# Patient Record
Sex: Female | Born: 1940 | Race: White | Hispanic: No | Smoking: Never smoker
Health system: Southern US, Community
[De-identification: ages and names within clinical notes are randomized; demographics above are authoritative.]

## PROBLEM LIST (undated history)

## (undated) ENCOUNTER — Encounter

## (undated) DIAGNOSIS — G4733 Obstructive sleep apnea (adult) (pediatric): Secondary | ICD-10-CM

## (undated) DIAGNOSIS — E119 Type 2 diabetes mellitus without complications: Secondary | ICD-10-CM

## (undated) DIAGNOSIS — I10 Essential (primary) hypertension: Secondary | ICD-10-CM

## (undated) DIAGNOSIS — K7030 Alcoholic cirrhosis of liver without ascites: Secondary | ICD-10-CM

## (undated) DIAGNOSIS — I8500 Esophageal varices without bleeding: Secondary | ICD-10-CM

## (undated) DIAGNOSIS — E039 Hypothyroidism, unspecified: Secondary | ICD-10-CM

## (undated) DIAGNOSIS — J309 Allergic rhinitis, unspecified: Secondary | ICD-10-CM

## (undated) DIAGNOSIS — M797 Fibromyalgia: Secondary | ICD-10-CM

## (undated) DIAGNOSIS — J45909 Unspecified asthma, uncomplicated: Secondary | ICD-10-CM

## (undated) HISTORY — DX: Unspecified asthma, uncomplicated: J45.909

## (undated) HISTORY — PX: OTHER SURGICAL HISTORY: SHX169

## (undated) HISTORY — DX: Obstructive sleep apnea (adult) (pediatric): G47.33

## (undated) HISTORY — DX: Allergic rhinitis, unspecified: J30.9

## (undated) HISTORY — PX: ABDOMINAL HYSTERECTOMY: SHX81

## (undated) HISTORY — DX: Essential (primary) hypertension: I10

## (undated) HISTORY — DX: Type 2 diabetes mellitus without complications: E11.9

---

## 2000-07-05 ENCOUNTER — Ambulatory Visit (HOSPITAL_COMMUNITY): Admission: RE | Admit: 2000-07-05 | Discharge: 2000-07-05 | Payer: Self-pay | Admitting: Gastroenterology

## 2001-02-10 ENCOUNTER — Ambulatory Visit (HOSPITAL_COMMUNITY): Admission: RE | Admit: 2001-02-10 | Discharge: 2001-02-10 | Payer: Self-pay | Admitting: *Deleted

## 2001-02-10 ENCOUNTER — Encounter: Payer: Self-pay | Admitting: *Deleted

## 2001-02-18 ENCOUNTER — Ambulatory Visit: Admission: RE | Admit: 2001-02-18 | Discharge: 2001-02-18 | Payer: Self-pay | Admitting: Pulmonary Disease

## 2001-04-11 ENCOUNTER — Ambulatory Visit (HOSPITAL_BASED_OUTPATIENT_CLINIC_OR_DEPARTMENT_OTHER): Admission: RE | Admit: 2001-04-11 | Discharge: 2001-04-11 | Payer: Self-pay | Admitting: Pulmonary Disease

## 2001-11-26 ENCOUNTER — Encounter: Admission: RE | Admit: 2001-11-26 | Discharge: 2002-02-24 | Payer: Self-pay | Admitting: Internal Medicine

## 2003-10-21 ENCOUNTER — Observation Stay (HOSPITAL_COMMUNITY): Admission: RE | Admit: 2003-10-21 | Discharge: 2003-10-22 | Payer: Self-pay | Admitting: General Surgery

## 2007-06-12 ENCOUNTER — Ambulatory Visit (HOSPITAL_COMMUNITY): Admission: RE | Admit: 2007-06-12 | Discharge: 2007-06-12 | Payer: Self-pay | Admitting: Internal Medicine

## 2009-06-15 ENCOUNTER — Ambulatory Visit (HOSPITAL_COMMUNITY): Admission: RE | Admit: 2009-06-15 | Discharge: 2009-06-15 | Payer: Self-pay | Admitting: Internal Medicine

## 2010-09-08 NOTE — Op Note (Signed)
NAME:  Martha Wise                     ACCOUNT NO.:  [id number]   MEDICAL RECORD NO.:  [id number]                   PATIENT TYPE:  AMB   LOCATION:  DAY                                  FACILITY:  [facility]   PHYSICIAN:  Adolph Pollack, M.D.            DATE OF BIRTH:  11-03-1940   DATE OF PROCEDURE:  10/21/2003  DATE OF DISCHARGE:                                 OPERATIVE REPORT   PREOPERATIVE DIAGNOSIS:  Incarcerated ventral incisional hernia.   POSTOPERATIVE DIAGNOSIS:  Incarcerated ventral incisional hernia.   PROCEDURE:  Laparoscopic repair of incarcerated ventral incisional hernia  with mesh.   SURGEON:  Adolph Pollack, M.D.   ANESTHESIA:  General.   INDICATIONS:  [name] is a 70 year old female who has had two operations  through the periumbilical incisions, and now she has been noting a bulge is  increasing and size and starting to cause her pain.  In the office there is  a periumbilical bulge that was not able to be reducible in the periumbilical  incision.  She now presents for elective repair.   The procedure and the risks have been discussed with her.   TECHNIQUE:  She is seen in the holding area then brought to the operating  room, placed supine on the operating room table and general anesthetic was  administered.  A Foley catheter was placed in the bladder.  She had a little  bit of a rash around her umbilicus, but given the fact we were not going to  make a periumbilical incision, I felt it was okay to proceed.  The abdominal  wall was then sterilely prepped and draped, then I-Ban was used to cover the  skin.   I then made a small incision in the left upper quadrant and was able to  palpate the fascia.  Using Opti-View trocar (5 mm), I gently advanced the  trocar through the fascial layers and into the peritoneal cavity.  There the  omentum came directly into view.  I insufflated CO2 gas and then examined  the area under the trocar; all there was  seen was omentum, no bowel was  present and no intestinal injury was noted.  Following adequate insufflation  of the abdomen, I put a 5 mm trocar in the left lower quadrant.  I manually  reduced part of the omental contents from the anterior abdominal wall.  I  then grasped the remaining omentum with atraumatic grasped and pulling out  the hernia and exposing the defect (which measured 4 cm x 4 cm).  I then  used spinal needles to mark an area 3-4 cm away from the edges of the defect  in four quadrants.  I brought out a piece of 10 x 15 cm polypropylene mesh  with a nonadherent barrier.  This mesh was cut in an oval-type shape, and  then four anchoring sutures were placed in four quadrants of 0 Novofil.  The  mesh was then rolled up, placed into the abdominal cavity with the  nonadherent barrier facing the omentum and visceral side.  Four small  incisions were then made in the anterior abdominal wall (four quadrants).  The anchoring sutures were pulled up across the fascial bridge at each of  these four sites, and then tied down anchoring the mesh to the abdominal  wall.  Further anchoring of the mesh was performed with the spiral tacker.  This provided for more than adequate coverage of the hernia, as well as more  than adequate overlap.   The area was inspected and was hemostatic.  The pneumoperitoneum was  released, and I watched the omentum approximate the nonadherent side of the  mesh, and then I removed the remaining trocar.  I then closed all the  incisions with 4-0 Monocryl subcuticular stitches for the skin closure, and  followed these by Steri-Strips.  The periumbilical area had a Betadine soak-  up gauze, followed by dry gauze placed on it.   She tolerated the procedure well without any apparent complications.  She  was taken to the recovery room in satisfactory condition.                                               Adolph Pollack, M.D.    Kari Baars  D:  10/21/2003   T:  10/21/2003  Job:  [id number]   cc:   Lovenia Kim, D.O.  [address]  [place]  [place] [address]  Fax: [phone]   Malachi Pro. Ambrose Mantle, M.D.  [address]  [place]  [place] [address]  Fax: [phone]

## 2010-09-08 NOTE — Procedures (Signed)
[facility]  Patient:    Martha Wise, Martha Wise                  MRN: [id number] Proc. Date: 07/05/00 Adm. Date:  [id number] Attending:  Rich Brave CC:         Ammie Dalton, M.D.   Procedure Report  PROCEDURE:  Colonoscopy with polypectomy.  INDICATION:  History of intermittent rectal bleeding and need for colon cancer screening.  FINDINGS:  Moderate left-sided diverticulsis.  Medium sized polyp removed from sigmoid colon.  INFORMED CONSENT:  The nature, purpose, and risks of the procedure have been reviewed with the patient who provided written consent.  SEDATION:  Fentanyl 87.5 mcg and Versed 8 mg IV without arrhythmias or desaturation.  DESCRIPTION OF PROCEDURE:  The Olympus adult video colonoscope was advanced to the cecum, as identified by clear visualization of the ileocecal valve.  The base of the cecum was inspected, and pullback was then performed.  The quality of the prep was very good, and it is felt that all areas were well seen.  There was an 8-9 mm semi-pedunculated polyp on a fold at about 30 cm from the external anal opening, as measured during pullback.  This was removed by snare technique with complete hemostasis and no evidence of excessive cautery.  There was moderate left-sided diverticulosis.  There was no evidence of colitis, polyps other than the one mentioned above, cancer, or vascular malformations, and retroflexion of the rectum was unremarkable.  The patient tolerated the procedure well, and there were no apparent complications.  To reach the cecum, we did have to do some overriding of loops.  IMPRESSION: 1. Sigmoid polyp. 2. Left-sided diverticulosis. 3. Intermittent rectal bleeding felt to be most likely of hemorrhoidal origin    since no alternative site was seen on todays exam.  PLAN:  Await pathology on the polyp.  Probable colonoscopic follow-up in three years. DD:  07/05/00 TD:   07/05/00 Job: [id number]

## 2010-12-14 ENCOUNTER — Other Ambulatory Visit: Payer: Self-pay | Admitting: Internal Medicine

## 2010-12-14 DIAGNOSIS — R7989 Other specified abnormal findings of blood chemistry: Secondary | ICD-10-CM

## 2010-12-18 ENCOUNTER — Ambulatory Visit
Admission: RE | Admit: 2010-12-18 | Discharge: 2010-12-18 | Disposition: A | Discharge status: Home / Self Care | Payer: Medicare Other | Source: Ambulatory Visit | Attending: Internal Medicine | Admitting: Internal Medicine

## 2010-12-26 ENCOUNTER — Other Ambulatory Visit: Payer: Self-pay | Admitting: Internal Medicine

## 2010-12-26 DIAGNOSIS — R160 Hepatomegaly, not elsewhere classified: Secondary | ICD-10-CM

## 2010-12-28 ENCOUNTER — Ambulatory Visit
Admission: RE | Admit: 2010-12-28 | Discharge: 2010-12-28 | Disposition: A | Discharge status: Home / Self Care | Payer: Medicare Other | Source: Ambulatory Visit | Attending: Internal Medicine | Admitting: Internal Medicine

## 2010-12-28 DIAGNOSIS — R160 Hepatomegaly, not elsewhere classified: Secondary | ICD-10-CM

## 2010-12-28 MED ORDER — IOHEXOL 350 MG/ML SOLN
125.0000 mL | Freq: Once | INTRAVENOUS | Status: AC | PRN
  Administered 2010-12-28: 125 mL via INTRAVENOUS

## 2011-04-27 ENCOUNTER — Ambulatory Visit (HOSPITAL_COMMUNITY)
Admission: RE | Admit: 2011-04-27 | Discharge: 2011-04-27 | Disposition: A | Discharge status: Home / Self Care | Payer: Medicare Other | Source: Ambulatory Visit | Attending: Internal Medicine | Admitting: Internal Medicine

## 2011-04-27 ENCOUNTER — Other Ambulatory Visit (HOSPITAL_COMMUNITY): Payer: Self-pay | Admitting: Internal Medicine

## 2011-04-27 DIAGNOSIS — Z0000 Encounter for general adult medical examination without abnormal findings: Secondary | ICD-10-CM | POA: Insufficient documentation

## 2011-04-27 DIAGNOSIS — I10 Essential (primary) hypertension: Secondary | ICD-10-CM

## 2011-04-27 DIAGNOSIS — G4730 Sleep apnea, unspecified: Secondary | ICD-10-CM | POA: Insufficient documentation

## 2011-04-27 DIAGNOSIS — R062 Wheezing: Secondary | ICD-10-CM | POA: Insufficient documentation

## 2011-10-19 ENCOUNTER — Other Ambulatory Visit: Payer: Self-pay | Admitting: Gastroenterology

## 2011-10-19 DIAGNOSIS — K7460 Unspecified cirrhosis of liver: Secondary | ICD-10-CM

## 2012-01-30 ENCOUNTER — Encounter: Payer: Self-pay | Admitting: Pulmonary Disease

## 2012-01-30 ENCOUNTER — Other Ambulatory Visit: Payer: Medicare Other

## 2012-01-30 ENCOUNTER — Institutional Professional Consult (permissible substitution): Payer: Medicare Other | Admitting: Pulmonary Disease

## 2012-01-31 ENCOUNTER — Encounter: Payer: Self-pay | Admitting: Pulmonary Disease

## 2012-01-31 ENCOUNTER — Ambulatory Visit (INDEPENDENT_AMBULATORY_CARE_PROVIDER_SITE_OTHER): Payer: Medicare Other | Admitting: Pulmonary Disease

## 2012-01-31 VITALS — BP 134/70 | HR 85 | Temp 98.2°F | Ht 65.0 in | Wt 272.0 lb

## 2012-01-31 DIAGNOSIS — G4733 Obstructive sleep apnea (adult) (pediatric): Secondary | ICD-10-CM

## 2012-01-31 NOTE — Patient Instructions (Addendum)
Will get you a new cpap machine, and take this opportunity to re-optimize your pressure.  Will let you know the results. Continue to work on weight loss. followup with me in one year if doing well.

## 2012-01-31 NOTE — Assessment & Plan Note (Signed)
The patient has a history of obstructive sleep apnea, and has done extremely well on CPAP.  She unfortunately is using the same machine from 2002, and is over due for a new device.  Will send an order to her DME to arrange for a new machine, and we'll take this opportunity to re- optimize her pressure.  I have encouraged her to work aggressively on weight loss, and to keep up with her mask changes and supplies.  She will followup with me in one year if doing well.

## 2012-01-31 NOTE — Progress Notes (Signed)
  Subjective:    Patient ID: Martha Wise, female    DOB: [birth year], 71 y.o.   MRN: [id number]  HPI The patient is a 71 year old female who I've been asked to see for management of obstructive sleep apnea.  She was diagnosed in 2002, and has been on CPAP since that time.  She has done very well with the device, but unfortunately he is using the same machine from 2002.  It has begun to act up.  The patient uses a full facemask, and has kept up with her mask changes.  She feels rested in the mornings upon arising, but has some sleep pressure during the day.  She has been heard to have occasional breakthrough snoring.  She has noted sleep pressure at times with periods of inactivity, especially with reading or watching television.  She denies any sleepiness with driving.  The patient states that her weight is down 50-60 pounds of the last few years, and her Epworth score today is 10.  Sleep Questionnaire: What time do you typically go to bed?( Between what hours) 10 - 11 pm How long does it take you to fall asleep? pretty quick uses cpap How many times during the night do you wake up? 1 What time do you get out of bed to start your day? 0800 Do you drive or operate heavy machinery in your occupation? No How much has your weight changed (up or down) over the past two years? (In pounds) 50 lb (22.68 kg) Have you ever had a sleep study before? Yes If yes, location of study? [facility] If yes, date of study? 2002 Do you currently use CPAP? Yes If so, what pressure? not sure Do you wear oxygen at any time? No    Review of Systems  Constitutional: Positive for unexpected weight change. Negative for fever.  HENT: Positive for nosebleeds, congestion and trouble swallowing. Negative for ear pain, sore throat, rhinorrhea, sneezing, dental problem, postnasal drip and sinus pressure.   Eyes: Negative for redness and itching.  Respiratory: Positive for shortness of breath and wheezing. Negative for cough and chest  tightness.   Cardiovascular: Positive for leg swelling. Negative for palpitations.  Gastrointestinal: Negative for nausea and vomiting.  Genitourinary: Negative for dysuria.  Musculoskeletal: Positive for joint swelling.  Skin: Negative for rash.  Neurological: Negative for headaches.  Hematological: Does not bruise/bleed easily.  Psychiatric/Behavioral: Negative for dysphoric mood. The patient is nervous/anxious.        Objective:   Physical Exam Constitutional:  Obese female, no acute distress  HENT:  Nares patent without discharge  Oropharynx without exudate, palate and uvula are elongated.  Eyes:  Perrla, eomi, no scleral icterus  Neck:  No JVD, no TMG  Cardiovascular:  Normal rate, regular rhythm, no rubs or gallops.  No murmurs        Intact distal pulses  Pulmonary :  Normal breath sounds, no stridor or respiratory distress   No rales, rhonchi, or wheezing  Abdominal:  Soft, nondistended, bowel sounds present.  No tenderness noted.   Musculoskeletal:  1+ lower extremity edema noted.  Lymph Nodes:  No cervical lymphadenopathy noted  Skin:  No cyanosis noted  Neurologic:  Alert, appropriate, moves all 4 extremities without obvious deficit.         Assessment & Plan:

## 2012-02-13 ENCOUNTER — Encounter: Payer: Self-pay | Admitting: Pulmonary Disease

## 2012-02-20 ENCOUNTER — Ambulatory Visit
Admission: RE | Admit: 2012-02-20 | Discharge: 2012-02-20 | Disposition: A | Discharge status: Home / Self Care | Payer: Medicare Other | Source: Ambulatory Visit | Attending: Gastroenterology | Admitting: Gastroenterology

## 2012-02-20 ENCOUNTER — Other Ambulatory Visit: Payer: Self-pay | Admitting: Gastroenterology

## 2012-02-20 DIAGNOSIS — K7460 Unspecified cirrhosis of liver: Secondary | ICD-10-CM

## 2012-02-21 ENCOUNTER — Encounter: Payer: Self-pay | Admitting: Pulmonary Disease

## 2012-03-16 ENCOUNTER — Other Ambulatory Visit: Payer: Self-pay | Admitting: Pulmonary Disease

## 2012-03-16 DIAGNOSIS — G4733 Obstructive sleep apnea (adult) (pediatric): Secondary | ICD-10-CM

## 2012-03-25 ENCOUNTER — Ambulatory Visit (INDEPENDENT_AMBULATORY_CARE_PROVIDER_SITE_OTHER): Payer: Medicare Other | Admitting: Pulmonary Disease

## 2012-03-25 ENCOUNTER — Encounter: Payer: Self-pay | Admitting: Pulmonary Disease

## 2012-03-25 VITALS — BP 124/72 | HR 72 | Temp 98.2°F | Ht 64.25 in | Wt 276.2 lb

## 2012-03-25 DIAGNOSIS — G4733 Obstructive sleep apnea (adult) (pediatric): Secondary | ICD-10-CM

## 2012-03-25 NOTE — Progress Notes (Signed)
  Subjective:    Patient ID: Martha Wise, female    DOB: 1940/08/12, 71 y.o.   MRN: [id number]  HPI The patient comes in today for followup of her obstructive sleep apnea.  She has gotten a new CPAP machine, and we have optimized her pressure to 13 cm of water.  She has been wearing CPAP religiously, and feels that it is really helping her sleep and daytime alertness.  Her only complaint is an issue with her mask being a little small, and she has an upcoming appointment with her medical equipment company to look at different types of masks.   Review of Systems  Constitutional: Negative for fever and unexpected weight change.  HENT: Positive for congestion. Negative for ear pain, nosebleeds, sore throat, rhinorrhea, sneezing, trouble swallowing, dental problem, postnasal drip and sinus pressure.   Eyes: Negative for redness and itching.  Respiratory: Positive for cough ( in mornings), shortness of breath and wheezing. Negative for chest tightness.   Cardiovascular: Negative for palpitations and leg swelling.  Gastrointestinal: Negative for nausea and vomiting.  Genitourinary: Negative for dysuria.  Musculoskeletal: Negative for joint swelling.  Skin: Negative for rash.  Neurological: Positive for headaches.  Hematological: Does not bruise/bleed easily.  Psychiatric/Behavioral: Negative for dysphoric mood. The patient is nervous/anxious.        Objective:   Physical Exam Overweight female in no acute distress No skin breakdown or pressure necrosis from the CPAP mask Nose without purulence or discharge noted Neck without lymphadenopathy or thyromegaly Lower extremities have minimal edema, no cyanosis Alert and oriented, moves all 4 extremities.  Does not appear to be sleepy.       Assessment & Plan:

## 2012-03-25 NOTE — Assessment & Plan Note (Signed)
The patient is doing much better on her new CPAP machine, and feels that she is sleeping well.  He has an appointment with her medical equipment company for a mask fitting, and they will also set her machine on 13 cm of water.  I've encouraged her to work aggressively on weight loss, and to keep up with mask changes and supplies.

## 2012-03-25 NOTE — Patient Instructions (Addendum)
Continue with cpap, and work on weight loss Make sure [employer] sets your machine on 13, and work with them on a better fitting mask. Try turning heat up on your humidifier, or can try a cloth sleeve on your hose if it already has the heater filament inside the wall of the hose. followup with me in one year, but call if you are not doing well with cpap.

## 2012-05-10 ENCOUNTER — Other Ambulatory Visit: Payer: Self-pay | Admitting: Pulmonary Disease

## 2012-10-24 ENCOUNTER — Emergency Department (HOSPITAL_COMMUNITY)
Admission: EM | Admit: 2012-10-24 | Discharge: 2012-10-25 | Disposition: A | Discharge status: Home / Self Care | Payer: Medicare Other | Attending: Emergency Medicine | Admitting: Emergency Medicine

## 2012-10-24 ENCOUNTER — Encounter (HOSPITAL_COMMUNITY): Payer: Self-pay | Admitting: Emergency Medicine

## 2012-10-24 ENCOUNTER — Emergency Department (HOSPITAL_COMMUNITY): Payer: Medicare Other

## 2012-10-24 DIAGNOSIS — IMO0002 Reserved for concepts with insufficient information to code with codable children: Secondary | ICD-10-CM | POA: Insufficient documentation

## 2012-10-24 DIAGNOSIS — Z8709 Personal history of other diseases of the respiratory system: Secondary | ICD-10-CM | POA: Insufficient documentation

## 2012-10-24 DIAGNOSIS — Z79899 Other long term (current) drug therapy: Secondary | ICD-10-CM | POA: Insufficient documentation

## 2012-10-24 DIAGNOSIS — Z8669 Personal history of other diseases of the nervous system and sense organs: Secondary | ICD-10-CM | POA: Insufficient documentation

## 2012-10-24 DIAGNOSIS — Y9389 Activity, other specified: Secondary | ICD-10-CM | POA: Insufficient documentation

## 2012-10-24 DIAGNOSIS — J45909 Unspecified asthma, uncomplicated: Secondary | ICD-10-CM | POA: Insufficient documentation

## 2012-10-24 DIAGNOSIS — S8392XA Sprain of unspecified site of left knee, initial encounter: Secondary | ICD-10-CM

## 2012-10-24 DIAGNOSIS — E119 Type 2 diabetes mellitus without complications: Secondary | ICD-10-CM | POA: Insufficient documentation

## 2012-10-24 DIAGNOSIS — I10 Essential (primary) hypertension: Secondary | ICD-10-CM | POA: Insufficient documentation

## 2012-10-24 DIAGNOSIS — Z794 Long term (current) use of insulin: Secondary | ICD-10-CM | POA: Insufficient documentation

## 2012-10-24 DIAGNOSIS — Y929 Unspecified place or not applicable: Secondary | ICD-10-CM | POA: Insufficient documentation

## 2012-10-24 DIAGNOSIS — E669 Obesity, unspecified: Secondary | ICD-10-CM | POA: Insufficient documentation

## 2012-10-24 DIAGNOSIS — Z7982 Long term (current) use of aspirin: Secondary | ICD-10-CM | POA: Insufficient documentation

## 2012-10-24 MED ORDER — IBUPROFEN 800 MG PO TABS
800.0000 mg | ORAL_TABLET | Freq: Once | ORAL | Status: AC
  Administered 2012-10-25: 800 mg via ORAL
  Filled 2012-10-24: qty 1

## 2012-10-24 NOTE — ED Provider Notes (Signed)
History    CSN: [id number] Arrival date & time 10/24/12  2235  First MD Initiated Contact with Patient 10/24/12 2251     Chief Complaint  Patient presents with  . Knee Injury   (Consider location/radiation/quality/duration/timing/severity/associated sxs/prior Treatment) HPI Patient presents with knee injury. She states that she was getting out of her car which was on an embankment and misjudged her height, causing her to jam her knee. She states she was able to "hobble" on the knee greater than 4 steps, she has had progressively worsening pain and some mild swelling. Pain is worsened with bending and ambulation, better with rest. She took nothing for the pain. Denies numbness, tingling, weakness.  Past Medical History  Diagnosis Date  . OSA (obstructive sleep apnea)   . Asthma   . Hypertension   . Diabetes   . Allergic rhinitis    Past Surgical History  Procedure Laterality Date  . Abdominal hysterectomy    . Bladder tact     Family History  Problem Relation Age of Onset  . Allergies Mother   . Asthma Mother     children   . Heart disease      father side of family  . Cancer Mother    History  Substance Use Topics  . Smoking status: Never Smoker   . Smokeless tobacco: Never Used  . Alcohol Use: 0.0 oz/week     Comment: drinks wine    OB History   Grav Para Term Preterm Abortions TAB SAB Ect Mult Living                 Review of Systems  Constitutional: Positive for activity change.  Respiratory: Negative for shortness of breath.   Gastrointestinal: Negative for nausea and vomiting.  Musculoskeletal: Positive for joint swelling, arthralgias and gait problem. Negative for myalgias.  Skin: Negative for wound.  Neurological: Negative for dizziness, weakness and numbness.    Allergies  Phenergan; Hydrocodone; and Glimepiride  Home Medications   Current Outpatient Rx  Name  Route  Sig  Dispense  Refill  . aspirin 81 MG tablet   Oral   Take 81 mg by mouth  daily.         . cholecalciferol (VITAMIN D) 1000 UNITS tablet   Oral   Take 200 Units by mouth as needed.          Marland Kitchen FLUoxetine (PROZAC) 20 MG capsule   Oral   Take 20 mg by mouth daily.         . Fluticasone-Salmeterol (ADVAIR) 250-50 MCG/DOSE AEPB   Inhalation   Inhale 1 puff into the lungs daily.          Marland Kitchen LEVEMIR FLEXPEN 100 UNIT/ML injection      Take 20 units at noon and 70 units at bedtime         . metFORMIN (GLUCOPHAGE-XR) 500 MG 24 hr tablet   Oral   Take 500 mg by mouth. 2 tablets in the am         . NOVOFINE 30G X 8 MM MISC               . NOVOLOG FLEXPEN 100 UNIT/ML injection      Sliding Scale in AM...8 units at noon and 8 units at dinner         . ONETOUCH DELICA LANCETS MISC               . SYNTHROID 125 MCG tablet  Oral   Take 125 mcg by mouth daily.          . valsartan (DIOVAN) 320 MG tablet   Oral   Take 320 mg by mouth daily.          BP 165/83  Pulse 89  Temp(Src) 97.9 F (36.6 C) (Oral)  Resp 16  SpO2 96% Physical Exam  Nursing note and vitals reviewed. Constitutional: She is oriented to person, place, and time. She appears well-developed and well-nourished. No distress.  Pleasant obese female in NAD  HENT:  Head: Normocephalic and atraumatic.  Eyes: Conjunctivae are normal. No scleral icterus.  Neck: Normal range of motion.  Cardiovascular: Normal rate, regular rhythm and normal heart sounds.  Exam reveals no gallop and no friction rub.   No murmur heard. Pulmonary/Chest: Effort normal and breath sounds normal. No respiratory distress.  Abdominal: Soft. Bowel sounds are normal. She exhibits no distension and no mass. There is no tenderness. There is no guarding.  Musculoskeletal:  TTP in the posterior knee. Popliteal and distal pulses are normal. Flexion to 90 degrees with pain, extension to 180 without  Pain. No lateral or medial instability.  dtr normal  Neurological: She is alert and oriented to  person, place, and time.  Skin: Skin is warm and dry. She is not diaphoretic.    ED Course  Procedures (including critical care time) Labs Reviewed - No data to display Dg Knee Complete 4 Views Left  10/24/2012   *RADIOLOGY REPORT*  Clinical Data: Knee injury.  Jammed left knee stepping out of car. Left knee pain.  LEFT KNEE - COMPLETE 4+ VIEW  Comparison: None.  Findings: Degenerative changes in the left knee with mild tricompartmental narrowing and hypertrophic changes.  No evidence of acute fracture or subluxation.  No focal bone lesion or bone destruction.  Bone cortex and trabecular architecture appear intact.  No significant effusion.  IMPRESSION: Mild degenerative changes in the left knee.  No acute fractures demonstrated.   Original Report Authenticated By: Burman Nieves, M.D.   1. Knee sprain, left, initial encounter     MDM  Patient presenting with knee injury. She has a history of knee OA. She is able to ambulate, however it causes severe pain. Elveria Royals is negative. I do not suspect internal derangement. Will d/c with supportive care and follow up with her orthopedist Dr, Ambrose Mantle. The patient appears reasonably screened and/or stabilized for discharge and I doubt any other medical condition or other [facility] requiring further screening, evaluation, or treatment in the ED at this time prior to discharge.   Arthor Captain, PA-C 10/25/12 1347  Arthor Captain, PA-C 11/02/12 2108

## 2012-10-24 NOTE — ED Notes (Signed)
Patient reports that she was stepping out of a car and she jammed her left knee. The patient reports that her knee and back of her left leg hurts.

## 2012-10-25 MED ORDER — MELOXICAM 15 MG PO TABS: 15.0000 mg | ORAL_TABLET | Freq: Every day | ORAL | Status: DC

## 2012-11-08 NOTE — ED Provider Notes (Signed)
Medical screening examination/treatment/procedure(s) were performed by non-physician practitioner and as supervising physician I was immediately available for consultation/collaboration.  Ethelda Chick, MD 11/08/12 [phone]

## 2013-02-04 ENCOUNTER — Encounter: Payer: Self-pay | Admitting: Pulmonary Disease

## 2013-02-04 ENCOUNTER — Ambulatory Visit (INDEPENDENT_AMBULATORY_CARE_PROVIDER_SITE_OTHER): Payer: Medicare Other | Admitting: Pulmonary Disease

## 2013-02-04 VITALS — BP 132/78 | HR 69 | Temp 98.3°F | Ht 64.25 in | Wt 278.6 lb

## 2013-02-04 DIAGNOSIS — G4733 Obstructive sleep apnea (adult) (pediatric): Secondary | ICD-10-CM

## 2013-02-04 NOTE — Assessment & Plan Note (Signed)
The patient is doing well with CPAP, and feels that it is helping her sleep and daytime alertness.  We do need to make sure she does have to adjust her heat humidifier, and she also needs to keep up with her cushion changes.  I've also encouraged her to work aggressively on weight loss.

## 2013-02-04 NOTE — Patient Instructions (Signed)
Get your home care company to show you how to use heated humidifier. Keep up with mask cushion changes. Work on weight loss followup with me in one year if doing well.

## 2013-02-04 NOTE — Progress Notes (Signed)
  Subjective:    Patient ID: Martha Wise, female    DOB: [birth year], 72 y.o.   MRN: [id number]  HPI The patient comes in today for followup of her obstructive sleep apnea.  She is wearing CPAP compliantly, and feels that she sleeps well with the device.  She is satisfied with her daytime alertness.  She is having no issues with the pressure or the mask fit, but needs to keep up with her cushion changes more consistently.  She is worried about dryness during the winter months, and I have explained to her how to use her heated humidifier.  Of note, the patient's weight is stable from the last visit.   Review of Systems  Constitutional: Negative for fever and unexpected weight change.  HENT: Positive for nosebleeds ( h/o in the last year..d/t CPAP?). Negative for congestion, dental problem, ear pain, postnasal drip, rhinorrhea, sinus pressure, sneezing, sore throat and trouble swallowing.        Dry mouth  Eyes: Negative for redness and itching.  Respiratory: Negative for cough, chest tightness, shortness of breath and wheezing.   Cardiovascular: Negative for palpitations and leg swelling.  Gastrointestinal: Negative for nausea and vomiting.  Genitourinary: Negative for dysuria.  Musculoskeletal: Negative for joint swelling.  Skin: Negative for rash.  Neurological: Negative for headaches.  Hematological: Does not bruise/bleed easily.  Psychiatric/Behavioral: Negative for dysphoric mood. The patient is not nervous/anxious.        Objective:   Physical Exam Obese female in no acute distress Nose without purulent discharge noted No skin breakdown or pressure necrosis from the CPAP mask Lower extremities with mild edema, cyanosis Alert and oriented, moves all 4 extremities.       Assessment & Plan:

## 2013-03-02 ENCOUNTER — Other Ambulatory Visit: Payer: Self-pay | Admitting: Gastroenterology

## 2013-03-02 DIAGNOSIS — K7460 Unspecified cirrhosis of liver: Secondary | ICD-10-CM

## 2013-03-06 ENCOUNTER — Other Ambulatory Visit: Payer: Medicare Other

## 2013-03-25 ENCOUNTER — Ambulatory Visit
Admission: RE | Admit: 2013-03-25 | Discharge: 2013-03-25 | Disposition: A | Discharge status: Home / Self Care | Payer: Medicare Other | Source: Ambulatory Visit | Attending: Gastroenterology | Admitting: Gastroenterology

## 2013-03-25 ENCOUNTER — Ambulatory Visit: Payer: Medicare Other | Admitting: Pulmonary Disease

## 2013-03-25 DIAGNOSIS — K7460 Unspecified cirrhosis of liver: Secondary | ICD-10-CM

## 2013-04-17 ENCOUNTER — Other Ambulatory Visit: Payer: Self-pay | Admitting: Physician Assistant

## 2013-05-12 NOTE — Telephone Encounter (Signed)
PT CLOSED CHART IN MEDICAL MANAGER.

## 2014-01-14 ENCOUNTER — Other Ambulatory Visit: Payer: Self-pay | Admitting: Gastroenterology

## 2014-01-14 DIAGNOSIS — K7460 Unspecified cirrhosis of liver: Secondary | ICD-10-CM

## 2014-01-26 ENCOUNTER — Other Ambulatory Visit: Payer: Medicare Other

## 2014-01-27 ENCOUNTER — Ambulatory Visit
Admission: RE | Admit: 2014-01-27 | Discharge: 2014-01-27 | Disposition: A | Discharge status: Home / Self Care | Payer: Medicare Other | Source: Ambulatory Visit | Attending: Gastroenterology | Admitting: Gastroenterology

## 2014-01-27 DIAGNOSIS — K7460 Unspecified cirrhosis of liver: Secondary | ICD-10-CM

## 2014-01-27 MED ORDER — GADOXETATE DISODIUM 0.25 MMOL/ML IV SOLN
10.0000 mL | Freq: Once | INTRAVENOUS | Status: AC | PRN
  Administered 2014-01-27: 10 mL via INTRAVENOUS

## 2014-02-10 ENCOUNTER — Ambulatory Visit (INDEPENDENT_AMBULATORY_CARE_PROVIDER_SITE_OTHER): Payer: Medicare Other | Admitting: Pulmonary Disease

## 2014-02-10 ENCOUNTER — Encounter: Payer: Self-pay | Admitting: Pulmonary Disease

## 2014-02-10 VITALS — BP 124/60 | HR 62 | Temp 99.0°F | Ht 65.0 in | Wt 276.0 lb

## 2014-02-10 DIAGNOSIS — G4733 Obstructive sleep apnea (adult) (pediatric): Secondary | ICD-10-CM

## 2014-02-10 NOTE — Patient Instructions (Signed)
Continue on cpap, and keep up with mask changes and supplies. Make adjustments to your heated humidifier if it is not already at max, and can also try nasal saline spray during day and at night before going to bed. If you continue to have mask leaks, would consider a fitting session during the day over at the sleep center.  Work on weight loss followup with me again in one year.

## 2014-02-10 NOTE — Assessment & Plan Note (Signed)
The patient is doing very well overall with her CPAP, and continues to have satisfactory sleep and daytime alertness. Her download shows excellent compliance and good control of her AHI. She is having some mask leak issues that she is working through, but I have offered to refer her to the sleep Center for a mask fitting session if she chooses to do so. I have also encouraged her to keep up with her cushion changes and supplies, and to work aggressively on weight loss. I will see her back in one year if doing well.

## 2014-02-10 NOTE — Progress Notes (Signed)
   Subjective:    Patient ID: Martha Wise, female    DOB: 05/14/1940, 73 y.o.   MRN: [id number]  HPI The patient comes in today for followup of her obstructive sleep apnea. She is wearing CPAP compliantly by her download, and has excellent control of her AHI. She is having some issues with mask fit, but is due for a new cushion. She is also having some issues with epistaxis, but she thinks that her heated humidifier is at a maximum setting. She does not use nasal corticosteroids. She feels that her sleep is excellent on the CPAP, and has satisfactory daytime alertness.   Review of Systems  Constitutional: Negative for fever and unexpected weight change.  HENT: Negative for congestion, dental problem, ear pain, nosebleeds, postnasal drip, rhinorrhea, sinus pressure, sneezing, sore throat and trouble swallowing.   Eyes: Negative for redness and itching.  Respiratory: Negative for cough, chest tightness, shortness of breath and wheezing.   Cardiovascular: Negative for palpitations and leg swelling.  Gastrointestinal: Negative for nausea and vomiting.  Genitourinary: Negative for dysuria.  Musculoskeletal: Negative for joint swelling.  Skin: Negative for rash.  Neurological: Negative for headaches.  Hematological: Does not bruise/bleed easily.  Psychiatric/Behavioral: Negative for dysphoric mood. The patient is not nervous/anxious.        Objective:   Physical Exam Really obese female in no acute distress Nose without purulence or discharge noted Neck without lymphadenopathy or thyromegaly No skin breakdown or pressure necrosis from the CPAP mask Lower extremities with edema noted, no cyanosis Alert and oriented, moves all 4 extremities.       Assessment & Plan:

## 2014-07-02 ENCOUNTER — Encounter: Payer: Self-pay | Admitting: Podiatry

## 2014-07-02 ENCOUNTER — Ambulatory Visit (INDEPENDENT_AMBULATORY_CARE_PROVIDER_SITE_OTHER): Payer: Medicare Other | Admitting: Podiatry

## 2014-07-02 VITALS — BP 164/7 | HR 80 | Resp 12

## 2014-07-02 DIAGNOSIS — M79676 Pain in unspecified toe(s): Secondary | ICD-10-CM | POA: Diagnosis not present

## 2014-07-02 DIAGNOSIS — B351 Tinea unguium: Secondary | ICD-10-CM

## 2014-07-02 NOTE — Patient Instructions (Signed)
Diabetes and Foot Care Diabetes may cause you to have problems because of poor blood supply (circulation) to your feet and legs. This may cause the skin on your feet to become thinner, break easier, and heal more slowly. Your skin may become dry, and the skin may peel and crack. You may also have nerve damage in your legs and feet causing decreased feeling in them. You may not notice minor injuries to your feet that could lead to infections or more serious problems. Taking care of your feet is one of the most important things you can do for yourself.  HOME CARE INSTRUCTIONS  Wear shoes at all times, even in the house. Do not go barefoot. Bare feet are easily injured.  Check your feet daily for blisters, cuts, and redness. If you cannot see the bottom of your feet, use a mirror or ask someone for help.  Wash your feet with warm water (do not use hot water) and mild soap. Then pat your feet and the areas between your toes until they are completely dry. Do not soak your feet as this can dry your skin.  Apply a moisturizing lotion or petroleum jelly (that does not contain alcohol and is unscented) to the skin on your feet and to dry, brittle toenails. Do not apply lotion between your toes.  Trim your toenails straight across. Do not dig under them or around the cuticle. File the edges of your nails with an emery board or nail file.  Do not cut corns or calluses or try to remove them with medicine.  Wear clean socks or stockings every day. Make sure they are not too tight. Do not wear knee-high stockings since they may decrease blood flow to your legs.  Wear shoes that fit properly and have enough cushioning. To break in new shoes, wear them for just a few hours a day. This prevents you from injuring your feet. Always look in your shoes before you put them on to be sure there are no objects inside.  Do not cross your legs. This may decrease the blood flow to your feet.  If you find a minor scrape,  cut, or break in the skin on your feet, keep it and the skin around it clean and dry. These areas may be cleansed with mild soap and water. Do not cleanse the area with peroxide, alcohol, or iodine.  When you remove an adhesive bandage, be sure not to damage the skin around it.  If you have a wound, look at it several times a day to make sure it is healing.  Do not use heating pads or hot water bottles. They may burn your skin. If you have lost feeling in your feet or legs, you may not know it is happening until it is too late.  Make sure your health care provider performs a complete foot exam at least annually or more often if you have foot problems. Report any cuts, sores, or bruises to your health care provider immediately. SEEK MEDICAL CARE IF:   You have an injury that is not healing.  You have cuts or breaks in the skin.  You have an ingrown nail.  You notice redness on your legs or feet.  You feel burning or tingling in your legs or feet.  You have pain or cramps in your legs and feet.  Your legs or feet are numb.  Your feet always feel cold. SEEK IMMEDIATE MEDICAL CARE IF:   There is increasing redness,   swelling, or pain in or around a wound.  There is a red line that goes up your leg.  Pus is coming from a wound.  You develop a fever or as directed by your health care provider.  You notice a bad smell coming from an ulcer or wound. Document Released: 04/06/2000 Document Revised: 12/10/2012 Document Reviewed: 09/16/2012 Exit[employer] Patient Information 2015 [employer]. This information is not intended to replace advice given to you by your health care provider. Make sure you discuss any questions you have with your health care provider.  

## 2014-07-02 NOTE — Progress Notes (Signed)
   Subjective:    Patient ID: Martha Wise, female    DOB: 01/27/1941, 74 y.o.   MRN: [id number]  HPI  74 year old female presents the office with complaints of right big toenail thickness and discoloration. She states the nails painful particularly with shoe gear and pressure. She denies any redness or drainage on the nail site. She states the remaining nails are also painful elongated particularly shoes but they're not as thick as the right big toenail. She also states that she does have some subjective numbness into into her feet and she feels a "sticky" sensation to the bottoms of her feet. This has been ongoing for several months. No other complaints at this time.  Review of Systems  Musculoskeletal: Positive for gait problem.  Skin: Positive for color change.  All other systems reviewed and are negative.      Objective:   Physical Exam AAO 3, NAD DP/PT pulses palpable, CRT less than 3 seconds Protective sensation intact with Simms Weinstein monofilament, vibratory sensation intact, Achilles tendon reflex intact. Nails are hypertrophic, dystrophic, elongated, brittle, discolored 10. Particular the right hallux toenail severely thickened and loosen the underlying nailbed distally however it is adhered proximally. There subjective tenderness on nails 1-5 bilaterally. There is no surrounding erythema or drainage on nail sites. No other areas of tenderness to bilateral lower extremities. No overlying edema, erythema, increase in warmth. MMT 5/5, ROM WNL No open lesions or pre-ulcer lesions identified bilaterally. No interdigital maceration. No pain with calf compression, swelling, warmth, erythema.      Assessment & Plan:  74 year old female with symptomatic onychomycosis, possible early neuropathy -Treatment options were discussed including alternatives, risks, complications. -Nail sharply debrided 10 without complications/bleeding. -Discussed likely early neuropathy symptoms.  Follow-up with primary care physician. We'll continue to monitor -Discussed importance of daily foot inspection. -Follow-up in 3 months or sooner if any problems are to arise. In the meantime encouraged to call the office with any questions, concerns, changes symptoms.

## 2014-10-01 ENCOUNTER — Encounter: Payer: Self-pay | Admitting: Podiatry

## 2014-10-01 ENCOUNTER — Ambulatory Visit (INDEPENDENT_AMBULATORY_CARE_PROVIDER_SITE_OTHER): Payer: Medicare Other | Admitting: Podiatry

## 2014-10-01 DIAGNOSIS — B351 Tinea unguium: Secondary | ICD-10-CM

## 2014-10-01 DIAGNOSIS — M79676 Pain in unspecified toe(s): Secondary | ICD-10-CM | POA: Diagnosis not present

## 2014-10-06 NOTE — Progress Notes (Signed)
Patient ID: Martha Wise, female   DOB: 1941/03/23, 74 y.o.   MRN: [id number]  Subjective: 74 y.o.-year-old female returns the office today for painful, elongated, thickened toenails which she is unable to trim herself. Denies any redness or drainage around the nails. She has bee using epsom salt soaks and vicks to the toenails. Denies any acute changes since last appointment and no new complaints today. Denies any systemic complaints such as fevers, chills, nausea, vomiting.   Objective: AAO 3, NAD DP/PT pulses palpable, CRT less than 3 seconds Protective sensation intact with Simms Weinstein monofilament Nails hypertrophic, dystrophic, elongated, brittle, discolored 10. There is tenderness overlying these nails. There is no surrounding erythema or drainage along the nail sites. No open lesions or pre-ulcerative lesions are identified. No other areas of tenderness bilateral lower extremities. No overlying edema, erythema, increased warmth. No pain with calf compression, swelling, warmth, erythema.  Assessment: Patient presents with symptomatic onychomycosis  Plan: -Treatment options including alternatives, risks, complications were discussed -Nails sharply debrided 10 without complication/bleeding. -Discussed daily foot inspection. If there are any changes, to call the office immediately.  -Follow-up in 3 months or sooner if any problems are to arise. In the meantime, encouraged to call the office with any questions, concerns, changes symptoms.

## 2014-11-15 ENCOUNTER — Inpatient Hospital Stay (HOSPITAL_COMMUNITY)
Admission: EM | Admit: 2014-11-15 | Discharge: 2014-11-17 | DRG: 377 | Disposition: A | Discharge status: Home Health | Payer: Medicare Other | Attending: Internal Medicine | Admitting: Internal Medicine

## 2014-11-15 ENCOUNTER — Encounter (HOSPITAL_COMMUNITY): Payer: Self-pay | Admitting: Emergency Medicine

## 2014-11-15 DIAGNOSIS — E8881 Metabolic syndrome: Secondary | ICD-10-CM | POA: Diagnosis present

## 2014-11-15 DIAGNOSIS — N179 Acute kidney failure, unspecified: Secondary | ICD-10-CM | POA: Diagnosis present

## 2014-11-15 DIAGNOSIS — F419 Anxiety disorder, unspecified: Secondary | ICD-10-CM | POA: Diagnosis present

## 2014-11-15 DIAGNOSIS — R61 Generalized hyperhidrosis: Secondary | ICD-10-CM | POA: Diagnosis present

## 2014-11-15 DIAGNOSIS — K7030 Alcoholic cirrhosis of liver without ascites: Secondary | ICD-10-CM | POA: Diagnosis present

## 2014-11-15 DIAGNOSIS — Z794 Long term (current) use of insulin: Secondary | ICD-10-CM | POA: Diagnosis not present

## 2014-11-15 DIAGNOSIS — K3189 Other diseases of stomach and duodenum: Secondary | ICD-10-CM | POA: Diagnosis present

## 2014-11-15 DIAGNOSIS — I10 Essential (primary) hypertension: Secondary | ICD-10-CM | POA: Diagnosis present

## 2014-11-15 DIAGNOSIS — K920 Hematemesis: Secondary | ICD-10-CM | POA: Diagnosis present

## 2014-11-15 DIAGNOSIS — K769 Liver disease, unspecified: Secondary | ICD-10-CM | POA: Diagnosis present

## 2014-11-15 DIAGNOSIS — F10230 Alcohol dependence with withdrawal, uncomplicated: Secondary | ICD-10-CM | POA: Diagnosis not present

## 2014-11-15 DIAGNOSIS — K7581 Nonalcoholic steatohepatitis (NASH): Secondary | ICD-10-CM | POA: Diagnosis present

## 2014-11-15 DIAGNOSIS — Z86010 Personal history of colonic polyps: Secondary | ICD-10-CM

## 2014-11-15 DIAGNOSIS — E861 Hypovolemia: Secondary | ICD-10-CM | POA: Diagnosis present

## 2014-11-15 DIAGNOSIS — F1020 Alcohol dependence, uncomplicated: Secondary | ICD-10-CM | POA: Diagnosis present

## 2014-11-15 DIAGNOSIS — D696 Thrombocytopenia, unspecified: Secondary | ICD-10-CM | POA: Diagnosis present

## 2014-11-15 DIAGNOSIS — K922 Gastrointestinal hemorrhage, unspecified: Secondary | ICD-10-CM | POA: Diagnosis present

## 2014-11-15 DIAGNOSIS — Z6841 Body Mass Index (BMI) 40.0 and over, adult: Secondary | ICD-10-CM | POA: Diagnosis not present

## 2014-11-15 DIAGNOSIS — R7989 Other specified abnormal findings of blood chemistry: Secondary | ICD-10-CM | POA: Diagnosis present

## 2014-11-15 DIAGNOSIS — J45909 Unspecified asthma, uncomplicated: Secondary | ICD-10-CM | POA: Diagnosis present

## 2014-11-15 DIAGNOSIS — R571 Hypovolemic shock: Secondary | ICD-10-CM | POA: Diagnosis present

## 2014-11-15 DIAGNOSIS — E039 Hypothyroidism, unspecified: Secondary | ICD-10-CM | POA: Diagnosis present

## 2014-11-15 DIAGNOSIS — D62 Acute posthemorrhagic anemia: Secondary | ICD-10-CM | POA: Diagnosis not present

## 2014-11-15 DIAGNOSIS — R112 Nausea with vomiting, unspecified: Secondary | ICD-10-CM | POA: Diagnosis present

## 2014-11-15 DIAGNOSIS — E119 Type 2 diabetes mellitus without complications: Secondary | ICD-10-CM | POA: Diagnosis present

## 2014-11-15 DIAGNOSIS — Z825 Family history of asthma and other chronic lower respiratory diseases: Secondary | ICD-10-CM

## 2014-11-15 DIAGNOSIS — F1010 Alcohol abuse, uncomplicated: Secondary | ICD-10-CM | POA: Diagnosis present

## 2014-11-15 DIAGNOSIS — G4733 Obstructive sleep apnea (adult) (pediatric): Secondary | ICD-10-CM | POA: Diagnosis present

## 2014-11-15 DIAGNOSIS — R251 Tremor, unspecified: Secondary | ICD-10-CM | POA: Diagnosis present

## 2014-11-15 DIAGNOSIS — Z79899 Other long term (current) drug therapy: Secondary | ICD-10-CM | POA: Diagnosis not present

## 2014-11-15 DIAGNOSIS — M797 Fibromyalgia: Secondary | ICD-10-CM | POA: Diagnosis present

## 2014-11-15 DIAGNOSIS — Z809 Family history of malignant neoplasm, unspecified: Secondary | ICD-10-CM

## 2014-11-15 DIAGNOSIS — Z888 Allergy status to other drugs, medicaments and biological substances status: Secondary | ICD-10-CM | POA: Diagnosis not present

## 2014-11-15 DIAGNOSIS — Z791 Long term (current) use of non-steroidal anti-inflammatories (NSAID): Secondary | ICD-10-CM

## 2014-11-15 DIAGNOSIS — Z7982 Long term (current) use of aspirin: Secondary | ICD-10-CM | POA: Diagnosis not present

## 2014-11-15 HISTORY — DX: Alcoholic cirrhosis of liver without ascites: K70.30

## 2014-11-15 HISTORY — DX: Hypothyroidism, unspecified: E03.9

## 2014-11-15 HISTORY — DX: Esophageal varices without bleeding: I85.00

## 2014-11-15 HISTORY — DX: Fibromyalgia: M79.7

## 2014-11-15 LAB — CBC
HCT: 27.5 % — ABNORMAL LOW (ref 36.0–46.0)
HEMATOCRIT: 25 % — AB (ref 36.0–46.0)
HEMOGLOBIN: 9.1 g/dL — AB (ref 12.0–15.0)
Hemoglobin: 8.3 g/dL — ABNORMAL LOW (ref 12.0–15.0)
MCH: 31.6 pg (ref 26.0–34.0)
MCH: 31.7 pg (ref 26.0–34.0)
MCHC: 33.1 g/dL (ref 30.0–36.0)
MCHC: 33.2 g/dL (ref 30.0–36.0)
MCV: 95.5 fL (ref 78.0–100.0)
MCV: 95.4 fL (ref 78.0–100.0)
Platelets: 119 10*3/uL — ABNORMAL LOW (ref 150–400)
Platelets: 117 10*3/uL — ABNORMAL LOW (ref 150–400)
RBC: 2.88 MIL/uL — ABNORMAL LOW (ref 3.87–5.11)
RBC: 2.62 MIL/uL — ABNORMAL LOW (ref 3.87–5.11)
RDW: 14.1 % (ref 11.5–15.5)
RDW: 14.3 % (ref 11.5–15.5)
WBC: 10.1 10*3/uL (ref 4.0–10.5)
WBC: 9.5 10*3/uL (ref 4.0–10.5)

## 2014-11-15 LAB — GLUCOSE, CAPILLARY
Glucose-Capillary: 371 mg/dL — ABNORMAL HIGH (ref 65–99)
Glucose-Capillary: 350 mg/dL — ABNORMAL HIGH (ref 65–99)

## 2014-11-15 LAB — LIPASE, BLOOD: Lipase: 28 U/L (ref 22–51)

## 2014-11-15 LAB — PROTIME-INR
INR: 1.45 (ref 0.00–1.49)
Prothrombin Time: 17.7 seconds — ABNORMAL HIGH (ref 11.6–15.2)

## 2014-11-15 LAB — MRSA PCR SCREENING: MRSA by PCR: NEGATIVE

## 2014-11-15 LAB — COMPREHENSIVE METABOLIC PANEL
ALBUMIN: 2.6 g/dL — AB (ref 3.5–5.0)
ALT: 30 U/L (ref 14–54)
ANION GAP: 11 (ref 5–15)
AST: 58 U/L — ABNORMAL HIGH (ref 15–41)
Alkaline Phosphatase: 63 U/L (ref 38–126)
BILIRUBIN TOTAL: 2.2 mg/dL — AB (ref 0.3–1.2)
BUN: 38 mg/dL — ABNORMAL HIGH (ref 6–20)
CO2: 21 mmol/L — ABNORMAL LOW (ref 22–32)
Calcium: 9.1 mg/dL (ref 8.9–10.3)
Chloride: 102 mmol/L (ref 101–111)
Creatinine, Ser: 0.85 mg/dL (ref 0.44–1.00)
GFR calc non Af Amer: >60 mL/min (ref >60)
GLUCOSE: 410 mg/dL — AB (ref 65–99)
Potassium: 4.5 mmol/L (ref 3.5–5.1)
Sodium: 134 mmol/L — ABNORMAL LOW (ref 135–145)
TOTAL PROTEIN: 5.4 g/dL — AB (ref 6.5–8.1)

## 2014-11-15 LAB — POC OCCULT BLOOD, ED: Fecal Occult Bld: POSITIVE — AB

## 2014-11-15 LAB — ABO/RH: ABO/RH(D): O POS

## 2014-11-15 MED ORDER — LORAZEPAM 1 MG PO TABS
1.0000 mg | ORAL_TABLET | Freq: Two times a day (BID) | ORAL | Status: DC
Start: 2014-11-18 — End: 2014-11-17

## 2014-11-15 MED ORDER — CARBOXYMETHYLCELLUL-GLYCERIN 1-0.9 % OP GEL: 1.0000 [drp] | Freq: Every day | OPHTHALMIC | Status: DC | PRN

## 2014-11-15 MED ORDER — ONDANSETRON HCL 4 MG/2ML IJ SOLN: 4.0000 mg | Freq: Four times a day (QID) | INTRAMUSCULAR | Status: DC | PRN

## 2014-11-15 MED ORDER — INSULIN DETEMIR 100 UNIT/ML ~~LOC~~ SOLN
35.0000 [IU] | Freq: Two times a day (BID) | SUBCUTANEOUS | Status: DC
  Administered 2014-11-15 – 2014-11-17 (×4): 35 [IU] via SUBCUTANEOUS
  Filled 2014-11-15 (×6): qty 0.35

## 2014-11-15 MED ORDER — LORAZEPAM 1 MG PO TABS: 1.0000 mg | ORAL_TABLET | Freq: Three times a day (TID) | ORAL | Status: DC

## 2014-11-15 MED ORDER — FLUOXETINE HCL 20 MG PO CAPS
20.0000 mg | ORAL_CAPSULE | Freq: Every evening | ORAL | Status: DC
  Administered 2014-11-15 – 2014-11-16 (×2): 20 mg via ORAL
  Filled 2014-11-15 (×2): qty 1

## 2014-11-15 MED ORDER — LOPERAMIDE HCL 2 MG PO CAPS: 2.0000 mg | ORAL_CAPSULE | ORAL | Status: DC | PRN

## 2014-11-15 MED ORDER — THIAMINE HCL 100 MG/ML IJ SOLN
100.0000 mg | Freq: Once | INTRAMUSCULAR | Status: AC
  Administered 2014-11-15: 100 mg via INTRAMUSCULAR
  Filled 2014-11-15: qty 2

## 2014-11-15 MED ORDER — LORAZEPAM 1 MG PO TABS
1.0000 mg | ORAL_TABLET | Freq: Four times a day (QID) | ORAL | Status: AC
  Administered 2014-11-15 – 2014-11-16 (×2): 1 mg via ORAL
  Filled 2014-11-15 (×3): qty 1

## 2014-11-15 MED ORDER — SODIUM CHLORIDE 0.9 % IJ SOLN
3.0000 mL | Freq: Two times a day (BID) | INTRAMUSCULAR | Status: DC
  Administered 2014-11-16 – 2014-11-17 (×2): 3 mL via INTRAVENOUS

## 2014-11-15 MED ORDER — THIAMINE HCL 100 MG/ML IJ SOLN
Freq: Once | INTRAVENOUS | Status: AC
  Administered 2014-11-15: 19:00:00 via INTRAVENOUS
  Filled 2014-11-15: qty 1000

## 2014-11-15 MED ORDER — SODIUM CHLORIDE 0.9 % IV SOLN
1000.0000 mL | Freq: Once | INTRAVENOUS | Status: AC
  Administered 2014-11-15: 1000 mL via INTRAVENOUS

## 2014-11-15 MED ORDER — PANTOPRAZOLE SODIUM 40 MG IV SOLR
40.0000 mg | Freq: Two times a day (BID) | INTRAVENOUS | Status: DC
Start: 2014-11-19 — End: 2014-11-17

## 2014-11-15 MED ORDER — LORAZEPAM 1 MG PO TABS: 1.0000 mg | ORAL_TABLET | Freq: Every day | ORAL | Status: DC

## 2014-11-15 MED ORDER — VITAMIN B-1 100 MG PO TABS
100.0000 mg | ORAL_TABLET | Freq: Every day | ORAL | Status: DC
  Administered 2014-11-17: 100 mg via ORAL
  Filled 2014-11-15 (×2): qty 1

## 2014-11-15 MED ORDER — CHLORHEXIDINE GLUCONATE 0.12 % MT SOLN
15.0000 mL | Freq: Two times a day (BID) | OROMUCOSAL | Status: DC
  Administered 2014-11-16 – 2014-11-17 (×3): 15 mL via OROMUCOSAL
  Filled 2014-11-15 (×3): qty 15

## 2014-11-15 MED ORDER — CETYLPYRIDINIUM CHLORIDE 0.05 % MT LIQD
7.0000 mL | Freq: Two times a day (BID) | OROMUCOSAL | Status: DC
  Administered 2014-11-17: 7 mL via OROMUCOSAL

## 2014-11-15 MED ORDER — LORAZEPAM 1 MG PO TABS: 1.0000 mg | ORAL_TABLET | Freq: Four times a day (QID) | ORAL | Status: DC | PRN

## 2014-11-15 MED ORDER — LEVOTHYROXINE SODIUM 137 MCG PO TABS
137.0000 ug | ORAL_TABLET | Freq: Every day | ORAL | Status: DC
  Administered 2014-11-17: 137 ug via ORAL
  Filled 2014-11-15 (×3): qty 1

## 2014-11-15 MED ORDER — ONDANSETRON HCL 4 MG PO TABS: 4.0000 mg | ORAL_TABLET | Freq: Four times a day (QID) | ORAL | Status: DC | PRN

## 2014-11-15 MED ORDER — HYDROXYZINE HCL 25 MG PO TABS: 25.0000 mg | ORAL_TABLET | Freq: Four times a day (QID) | ORAL | Status: DC | PRN

## 2014-11-15 MED ORDER — ONDANSETRON 4 MG PO TBDP
4.0000 mg | ORAL_TABLET | Freq: Four times a day (QID) | ORAL | Status: DC | PRN
Start: 2014-11-15 — End: 2014-11-17
  Filled 2014-11-15: qty 1

## 2014-11-15 MED ORDER — ADULT MULTIVITAMIN W/MINERALS CH
1.0000 | ORAL_TABLET | Freq: Every day | ORAL | Status: DC
  Administered 2014-11-15 – 2014-11-17 (×2): 1 via ORAL
  Filled 2014-11-15 (×3): qty 1

## 2014-11-15 MED ORDER — SODIUM CHLORIDE 0.9 % IV SOLN
1000.0000 mL | INTRAVENOUS | Status: DC
  Administered 2014-11-15 – 2014-11-16 (×2): 1000 mL via INTRAVENOUS

## 2014-11-15 MED ORDER — SODIUM CHLORIDE 0.9 % IV SOLN
8.0000 mg/h | INTRAVENOUS | Status: DC
  Administered 2014-11-15 – 2014-11-17 (×5): 8 mg/h via INTRAVENOUS
  Filled 2014-11-15 (×10): qty 80

## 2014-11-15 MED ORDER — SODIUM CHLORIDE 0.9 % IV SOLN
80.0000 mg | Freq: Once | INTRAVENOUS | Status: AC
  Administered 2014-11-15: 80 mg via INTRAVENOUS
  Filled 2014-11-15: qty 80

## 2014-11-15 MED ORDER — POLYVINYL ALCOHOL 1.4 % OP SOLN
1.0000 [drp] | Freq: Every day | OPHTHALMIC | Status: DC | PRN
  Filled 2014-11-15: qty 15

## 2014-11-15 MED ORDER — MOMETASONE FURO-FORMOTEROL FUM 100-5 MCG/ACT IN AERO: 2.0000 | INHALATION_SPRAY | Freq: Two times a day (BID) | RESPIRATORY_TRACT | Status: DC

## 2014-11-15 MED ORDER — INSULIN ASPART 100 UNIT/ML ~~LOC~~ SOLN
0.0000 [IU] | SUBCUTANEOUS | Status: DC
  Administered 2014-11-15: 15 [IU] via SUBCUTANEOUS
  Administered 2014-11-16 (×3): 8 [IU] via SUBCUTANEOUS
  Administered 2014-11-16: 5 [IU] via SUBCUTANEOUS
  Administered 2014-11-16: 8 [IU] via SUBCUTANEOUS
  Administered 2014-11-17: 2 [IU] via SUBCUTANEOUS
  Administered 2014-11-17: 3 [IU] via SUBCUTANEOUS
  Administered 2014-11-17: 2 [IU] via SUBCUTANEOUS

## 2014-11-15 MED ORDER — FLUTICASONE PROPIONATE 50 MCG/ACT NA SUSP: 1.0000 | Freq: Every day | NASAL | Status: DC

## 2014-11-15 NOTE — Progress Notes (Signed)
RT placed patient on CPAP. Patient setting is auto 6-16 cmH2O. Sterile water added to water chamber for humidification. Patient is tolerating well at this time. Encouraged patient to call if she needed further assistance. RT will continue to monitor.

## 2014-11-15 NOTE — ED Provider Notes (Addendum)
CSN: [id number]     Arrival date & time 11/15/14  1118 History   First MD Initiated Contact with Patient 11/15/14 1135     Chief Complaint  Patient presents with  . Hematemesis      The history is provided by the patient.   Patient has a history of alcoholic cirrhosis and known varices.  She's never had a variceal bleed.  She presents today with vomiting of frank blood.  She had 2 episodes of hematemesis on Friday evening which was 2 and half days ago.  Through the weekend she continued to do well but was nauseated and then developed frank hematemesis again this morning at 10 AM.  She's had no more vomiting of blood since then.  She denies gross blood in her stool.  She reports generalized fatigue.  She is not on any anticoagulants.  She continues to drink alcohol.  She was given 4 mg of IV Zofran by EMS with some improvement in her nausea.  No other complaints.   Past Medical History  Diagnosis Date  . OSA (obstructive sleep apnea)   . Asthma   . Hypertension   . Diabetes   . Allergic rhinitis   . Fibromyalgia   . Alcoholic cirrhosis   . Esophageal varices   . Hypothyroid    Past Surgical History  Procedure Laterality Date  . Abdominal hysterectomy    . Bladder tact     Family History  Problem Relation Age of Onset  . Allergies Mother   . Asthma Mother     children   . Heart disease      father side of family  . Cancer Mother    History  Substance Use Topics  . Smoking status: Never Smoker   . Smokeless tobacco: Never Used  . Alcohol Use: 0.0 oz/week     Comment: drinks wine    OB History    No data available     Review of Systems  All other systems reviewed and are negative.     Allergies  Phenergan; Hydrocodone; and Glimepiride  Home Medications   Prior to Admission medications   Medication Sig Start Date End Date Taking? Authorizing Provider  aspirin 81 MG tablet Take 81 mg by mouth every morning.    Yes Historical Provider, MD  B-D ULTRAFINE III  SHORT PEN 31G X 8 MM MISC  06/22/14  Yes Historical Provider, MD  Carboxymethylcellul-Glycerin (REFRESH OPTIVE) 1-0.9 % GEL Apply 1-2 drops to eye daily as needed.   Yes Historical Provider, MD  cholecalciferol (VITAMIN D) 1000 UNITS tablet Take 3,000 Units by mouth every other day.    Yes Historical Provider, MD  docusate sodium (COLACE) 100 MG capsule Take 100 mg by mouth every evening.   Yes Historical Provider, MD  FLUoxetine (PROZAC) 20 MG capsule Take 20 mg by mouth every evening.    Yes Historical Provider, MD  HUMALOG KWIKPEN 100 UNIT/ML KiwkPen Inject 20 Units as directed 3 (three) times daily. 11/02/14  Yes Historical Provider, MD  LEVEMIR FLEXTOUCH 100 UNIT/ML Pen Inject 80 Units as directed 2 (two) times daily. 11/03/14  Yes Historical Provider, MD  metFORMIN (GLUCOPHAGE-XR) 500 MG 24 hr tablet Take 1,000 mg by mouth daily with breakfast. 2 tablets in the am 01/17/12  Yes Historical Provider, MD  Naproxen Sodium (ALEVE) 220 MG CAPS Take 220 mg by mouth 2 (two) times daily as needed.   Yes Historical Provider, MD  ONE TOUCH ULTRA TEST test strip  06/16/14  Yes Historical Provider, MD  SYNTHROID 137 MCG tablet  06/15/14  Yes Historical Provider, MD  valsartan (DIOVAN) 320 MG tablet Take 320 mg by mouth every morning.    Yes Historical Provider, MD  fluticasone (FLONASE) 50 MCG/ACT nasal spray Place into both nostrils daily.    Historical Provider, MD  Fluticasone-Salmeterol (ADVAIR DISKUS) 250-50 MCG/DOSE AEPB take 1 puff by mouth twice a day during the winter 04/17/13   Melissa Smith, PA-C   BP 120/43 mmHg  Pulse 98  Temp(Src) 98.2 F (36.8 C) (Oral)  Resp 15  SpO2 100% Physical Exam  Constitutional: She is oriented to person, place, and time. She appears well-developed and well-nourished. No distress.  HENT:  Head: Normocephalic and atraumatic.  Eyes: EOM are normal.  Neck: Normal range of motion.  Cardiovascular: Normal rate, regular rhythm and normal heart sounds.    Pulmonary/Chest: Effort normal and breath sounds normal.  Abdominal: Soft. She exhibits no distension. There is no tenderness.  Musculoskeletal: Normal range of motion.  Neurological: She is alert and oriented to person, place, and time.  Skin: Skin is warm and dry.  Psychiatric: She has a normal mood and affect. Judgment normal.  Nursing note and vitals reviewed.   ED Course  Procedures (including critical care time) Labs Review Labs Reviewed  COMPREHENSIVE METABOLIC PANEL - Abnormal; Notable for the following:    Sodium 134 (*)    CO2 21 (*)    Glucose, Bld 410 (*)    BUN 38 (*)    Total Protein 5.4 (*)    Albumin 2.6 (*)    AST 58 (*)    Total Bilirubin 2.2 (*)    All other components within normal limits  CBC - Abnormal; Notable for the following:    RBC 2.88 (*)    Hemoglobin 9.1 (*)    HCT 27.5 (*)    Platelets 119 (*)    All other components within normal limits  PROTIME-INR - Abnormal; Notable for the following:    Prothrombin Time 17.7 (*)    All other components within normal limits  POC OCCULT BLOOD, ED - Abnormal; Notable for the following:    Fecal Occult Bld POSITIVE (*)    All other components within normal limits  LIPASE, BLOOD  OCCULT BLOOD X 1 CARD TO LAB, STOOL  TYPE AND SCREEN  ABO/RH    Imaging Review No results found.   EKG Interpretation None      MDM   Final diagnoses:  Upper GI bleed  Alcoholic cirrhosis of liver without ascites    Suspected variceal bleed.  Patient be started on Protonix.  At this time she is hemodynamically stable.  Will hold octreotide at this time.  I spoke with gastroenterology, Dr. Randa Evens who will see the patient in evaluation and consultation.  Patient will be admitted to the stepdown unit by the hospitalist    Azalia Bilis, MD 11/15/14 1624  Azalia Bilis, MD 11/26/14 [phone]

## 2014-11-15 NOTE — ED Notes (Signed)
Hospitalist at bedside 

## 2014-11-15 NOTE — H&P (Addendum)
[facility] History and Physical  Martha Wise:[id number] DOB: 04-19-1941 DOA: 11/15/2014  Referring physician: Patria Mane, EDP PCP: Hoyle Sauer, MD  Specialists: EDwards, GI   74 y/o ? OSA on CPAP diagnosed 2002, fibromyalgia Rx Dr. Kellie Simmering on Advil 4 tablets daily as well as muscle relaxants (takes ibuprofen tablets one only), cirrhosis and stable varices-supposed to have last endoscopy 02/19/2014 but did not get this, chronic daily drinker for the past 30 years of 2 x750 mL bottles Chardonnay, metabolic syndrome There is no weight on file to calculate BMI. , Incarcerated ventral incisional hernia 2005 Started to have nausea vomiting and discomfort in the abdomen on 11/12/14 She states that she felt nauseous on that evening and then proceeded to have one episode of bloody emesis and also felt diaphoretic and weak She decided to take rest at home did not have any further recurrence until 10/2514 where she had 1 large bright red bloody emesis at around 10 AM and then another large emesis just before the ambulance came to pick her up She's never had a bleed before and only takes an occasional Advil for her chronic musculoskeletal pains Note that the patient is also on aspirin 81 mg She does not smoke Does not take any other over-the-counter meds Quit drinking for about 2 months this year well off of that but relapsed unfortunately  - Chills - fever - cough - cold - nausea at present - Sputum - chest pain - unilateral weakness - rash - ill contacts - Falls - confusion - loss of consciousness  A 14 point ROS was performed and is negative except as noted in the HPI  Emergency room workup significant for hemoglobin 9.1 and elevated BUN/creatinine 38/      Past Medical History  Diagnosis Date  . OSA (obstructive sleep apnea)   . Asthma   . Hypertension   . Diabetes   . Allergic rhinitis   . Fibromyalgia   . Alcoholic cirrhosis   . Esophageal varices   .  Hypothyroid    Past Surgical History  Procedure Laterality Date  . Abdominal hysterectomy    . Bladder tact     Social History:  History   Social History Narrative    Allergies  Allergen Reactions  . Phenergan [Promethazine Hcl] Swelling    Lip swelling  . Hydrocodone Other (See Comments)    Gi upset/ abdominal pain  . Glimepiride Anxiety    Caused extreme anxiety      Family History  Problem Relation Age of Onset  . Allergies Mother   . Asthma Mother     children   . Heart disease      father side of family  . Cancer Mother     Prior to Admission medications   Medication Sig Start Date End Date Taking? Authorizing Provider  aspirin 81 MG tablet Take 81 mg by mouth every morning.    Yes Historical Provider, MD  B-D ULTRAFINE III SHORT PEN 31G X 8 MM MISC  06/22/14  Yes Historical Provider, MD  Carboxymethylcellul-Glycerin (REFRESH OPTIVE) 1-0.9 % GEL Apply 1-2 drops to eye daily as needed.   Yes Historical Provider, MD  cholecalciferol (VITAMIN D) 1000 UNITS tablet Take 3,000 Units by mouth every other day.    Yes Historical Provider, MD  docusate sodium (COLACE) 100 MG capsule Take 100 mg by mouth every evening.   Yes Historical Provider, MD  FLUoxetine (PROZAC) 20 MG capsule Take 20 mg by mouth every evening.  Yes Historical Provider, MD  HUMALOG KWIKPEN 100 UNIT/ML KiwkPen Inject 20 Units as directed 3 (three) times daily. 11/02/14  Yes Historical Provider, MD  LEVEMIR FLEXTOUCH 100 UNIT/ML Pen Inject 80 Units as directed 2 (two) times daily. 11/03/14  Yes Historical Provider, MD  metFORMIN (GLUCOPHAGE-XR) 500 MG 24 hr tablet Take 1,000 mg by mouth daily with breakfast. 2 tablets in the am 01/17/12  Yes Historical Provider, MD  Naproxen Sodium (ALEVE) 220 MG CAPS Take 220 mg by mouth 2 (two) times daily as needed.   Yes Historical Provider, MD  ONE TOUCH ULTRA TEST test strip  06/16/14  Yes Historical Provider, MD  SYNTHROID 137 MCG tablet  06/15/14  Yes Historical  Provider, MD  valsartan (DIOVAN) 320 MG tablet Take 320 mg by mouth every morning.    Yes Historical Provider, MD  fluticasone (FLONASE) 50 MCG/ACT nasal spray Place into both nostrils daily.    Historical Provider, MD  Fluticasone-Salmeterol (ADVAIR DISKUS) 250-50 MCG/DOSE AEPB take 1 puff by mouth twice a day during the winter 04/17/13   Loree Fee, PA-C   Physical Exam: Filed Vitals:   11/15/14 1133 11/15/14 1245 11/15/14 1330 11/15/14 1346  BP: 120/43   134/42  Pulse: 102 98 102 104  Temp: 98.2 F (36.8 C)     TempSrc: Oral     Resp: 18 15 18 20   SpO2: 92% 100% 99% 98%    Eomi, obese pleasant a little tremulous No pallor No ict NO LAN thryoid normal size,  s1 s2 slightly tachy-RRR, poor exam Abd obese with striae and diasstasis, mild hepatolmegally, no CVA tender Hemoccult and rectal deferred Finger nose finger intact Power grossly intact  Labs on Admission:  Basic Metabolic Panel:  Recent Labs Lab 11/15/14 1206  NA 134*  K 4.5  CL 102  CO2 21*  GLUCOSE 410*  BUN 38*  CREATININE 0.85  CALCIUM 9.1   Liver Function Tests:  Recent Labs Lab 11/15/14 1206  AST 58*  ALT 30  ALKPHOS 63  BILITOT 2.2*  PROT 5.4*  ALBUMIN 2.6*    Recent Labs Lab 11/15/14 1206  LIPASE 28   No results for input(s): AMMONIA in the last 168 hours. CBC:  Recent Labs Lab 11/15/14 1206  WBC 10.1  HGB 9.1*  HCT 27.5*  MCV 95.5  PLT 119*   Cardiac Enzymes: No results for input(s): CKTOTAL, CKMB, CKMBINDEX, TROPONINI in the last 168 hours.  BNP (last 3 results) No results for input(s): BNP in the last 8760 hours.  ProBNP (last 3 results) No results for input(s): PROBNP in the last 8760 hours.  CBG: No results for input(s): GLUCAP in the last 168 hours.  Radiological Exams on Admission: No results found.  EKG: Independently reviewed. None performed  Assessment/Plan Principal Problem:   Hypovolemic shock Active Problems:   OSA (obstructive sleep apnea)    Upper GI bleed   Acute GI bleeding   Morbid obesity with body mass index of 45.0-49.9 in adult   EtOH dependence   Cirrhosis with alcoholism   AKI (acute kidney injury) 2/2 to azotemia Assessment/plan  Hypovolemic/distributive shock -Aggressively fluid resuscitated with 2 L of IV saline in the emergency room -Keep on saline 100 cc per hour - Admitted to step down -Keep 2 large-bore IVs open  Hematemesis secondary to likely bleeding varices -2/2 to Naprocen and aspirin in setting heavy ETOH -Protonix PPI and admitted to step down -Do not think we need octreotide but if she needs any further would  start octreotide GTT -CBC every 12 -Orthostatics every shift -Okay to give essential meds such as Synthroid 137 every morning, Prozac 20 every afternoon otherwise hold all other medications including metformin 1000 twice a day, Diovan 320 AM -Likely will need endoscopy and potential ligation  Cirrhosis liver secondary to alcoholism -We will place on protocol given her heavy drinking -Patient needs banana bag -Obtain magnesium and phosphorus in the morning Cessation counseling is essential  Hypotension in the setting of shock with a history of hypertension -Hold Diovan 320 mg daily  Diabetes mellitus type 2 -Discontinue sliding scale coverage from home which is 20 units 3 times a day -Continue Levemir at half home dose of 40 units twice a day -Keep on saline and monitor  Acute kidney injury -Probably secondary to azotemia from upper GI bleed -Aggressive fluid repletion -Repeat complete metabolic panel a.m.  Fibromyalgia reported- -We'll need other options and non-pharmacological management -Could do with losing some weight? -We will discuss further  Obstructive sleep apnea -Continue home BiPAP if possible  Hypothyroidism -Continue Synthroid 137 daily by mouth if able to tolerate just sips if not we will have to convert to by mouth  CRITICAL CARE Performed by: Rhetta Mura   Total critical care time: 45  Critical care time was exclusive of separately billable procedures and treating other patients.  Critical care was necessary to treat or prevent imminent or life-threatening deterioration.  Critical care was time spent personally by me on the following activities: development of treatment plan with patient and/or surrogate as well as nursing, discussions with consultants, evaluation of patient's response to treatment, examination of patient, obtaining history from patient or surrogate, ordering and performing treatments and interventions, ordering and review of laboratory studies, ordering and review of radiographic studies, pulse oximetry and re-evaluation of patient's condition. c Time spent: 45 SDU Full code  Rhetta Mura [facility] Pager [phone]  If 7PM-7AM, please contact night-coverage [email or web address] Password [facility] 11/15/2014, 2:35 PM

## 2014-11-15 NOTE — Consult Note (Signed)
[facility] CON[facility] Reason for consult: Hematemesis inpatient with known cirrhosis Referring Physician: [facility]. PCP: Dr. Dagmar Hait. Primary G.I.: Dr. Kemper Durie Martha Wise is an 74 y.o. female.  HPI: has a long history of liver disease. She has been followed by Dr. Cristina Gong for some time for chronic liver disease with cirrhosis suggested by imaging studies. 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 The patient has a history of excessive ethanol consumption and states that she has cut down now to 2 bottles of wine daily. EGD's in the past that suggested possible varices, however, EGD by Dr. Cristina Gong 11/15 showed no obvious esophageal varices and possibly type I minimal gastric varices with no bleeding. 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 The patient has continued to drink. In addition she has a history of fibromyalgia has been on chronic NSAIDs for some time as well. She has taken both Naprosyn as well as ibuprofen in the past. She had been doing reasonably well up until about 3 days ago when she began to feel weak and have lower abdominal pain. She states that she began to have hematemesis of small amounts of bloody emesis. She states that the abdominal pain is in her lower abdomen. She denies taking any other over-the-counter NSAIDs. Her other chronic problems include hypothyroidism, type II diabetes, fibromyalgia, asthma, obstructive sleep apnea. She uses CPAP. She has had previous colon polyps removed in the past. PT is normally about 12.0 in total bilirubin 1.8 to 2 with albumin about 3.5.  Past Medical History  Diagnosis Date  . OSA (obstructive sleep apnea)   . Asthma   . Hypertension   . Diabetes   . Allergic rhinitis   . Fibromyalgia   . Alcoholic cirrhosis   . Esophageal varices   . Hypothyroid     Past Surgical History  Procedure Laterality Date  .  Abdominal hysterectomy    . Bladder tact      Family History  Problem Relation Age of Onset  . Allergies Mother   . Asthma Mother     children   . Heart disease      father side of family  . Cancer Mother     Social History:  reports that she has never smoked. She has never used smokeless tobacco. She reports that she drinks alcohol. She reports that she does not use illicit drugs.  Allergies:  Allergies  Allergen Reactions  . Phenergan [Promethazine Hcl] Swelling    Lip swelling  . Hydrocodone Other (See Comments)    Gi upset/ abdominal pain  . Glimepiride Anxiety    Caused extreme anxiety      Medications; Prior to Admission medications   Medication Sig Start Date End Date Taking? Authorizing Provider  aspirin 81 MG tablet Take 81 mg by mouth every morning.    Yes Historical Provider, MD  B-D ULTRAFINE III SHORT PEN 31G X 8 MM MISC  06/22/14  Yes Historical Provider, MD  Carboxymethylcellul-Glycerin (REFRESH OPTIVE) 1-0.9 % GEL Apply 1-2 drops to eye daily as needed.   Yes Historical Provider, MD  cholecalciferol (VITAMIN D) 1000 UNITS tablet Take 3,000 Units by mouth every other day.    Yes Historical Provider, MD  docusate sodium (COLACE) 100 MG capsule Take 100 mg by mouth every evening.   Yes Historical Provider, MD  FLUoxetine (PROZAC) 20 MG capsule Take 20 mg by mouth every evening.  Yes Historical Provider, MD  HUMALOG KWIKPEN 100 UNIT/ML KiwkPen Inject 20 Units as directed 3 (three) times daily. 11/02/14  Yes Historical Provider, MD  LEVEMIR FLEXTOUCH 100 UNIT/ML Pen Inject 80 Units as directed 2 (two) times daily. 11/03/14  Yes Historical Provider, MD  metFORMIN (GLUCOPHAGE-XR) 500 MG 24 hr tablet Take 1,000 mg by mouth daily with breakfast. 2 tablets in the am 01/17/12  Yes Historical Provider, MD  Naproxen Sodium (ALEVE) 220 MG CAPS Take 220 mg by mouth 2 (two) times daily as needed.   Yes Historical Provider, MD  ONE TOUCH ULTRA TEST test strip  06/16/14  Yes  Historical Provider, MD  SYNTHROID 137 MCG tablet  06/15/14  Yes Historical Provider, MD  valsartan (DIOVAN) 320 MG tablet Take 320 mg by mouth every morning.    Yes Historical Provider, MD  fluticasone (FLONASE) 50 MCG/ACT nasal spray Place into both nostrils daily.    Historical Provider, MD  Fluticasone-Salmeterol (ADVAIR DISKUS) 250-50 MCG/DOSE AEPB take 1 puff by mouth twice a day during the winter 04/17/13   Kelby Aline, PA-C   . [START ON 11/19/2014] pantoprazole (PROTONIX) IV  40 mg Intravenous Q12H   PRN Meds  Results for orders placed or performed during the hospital encounter of 11/15/14 (from the past 48 hour(s))  ABO/Rh     Status: None   Collection Time: 11/15/14 11:59 AM  Result Value Ref Range   ABO/RH(D) O POS   Comprehensive metabolic panel     Status: Abnormal   Collection Time: 11/15/14 12:06 PM  Result Value Ref Range   Sodium 134 (L) 135 - 145 mmol/L   Potassium 4.5 3.5 - 5.1 mmol/L   Chloride 102 101 - 111 mmol/L   CO2 21 (L) 22 - 32 mmol/L   Glucose, Bld 410 (H) 65 - 99 mg/dL   BUN 38 (H) 6 - 20 mg/dL   Creatinine, Ser 0.85 0.44 - 1.00 mg/dL   Calcium 9.1 8.9 - 10.3 mg/dL   Total Protein 5.4 (L) 6.5 - 8.1 g/dL   Albumin 2.6 (L) 3.5 - 5.0 g/dL   AST 58 (H) 15 - 41 U/L   ALT 30 14 - 54 U/L   Alkaline Phosphatase 63 38 - 126 U/L   Total Bilirubin 2.2 (H) 0.3 - 1.2 mg/dL   GFR calc non Af Amer >60 >60 mL/min   GFR calc Af Amer >60 >60 mL/min    Comment: (NOTE) The eGFR has been calculated using the CKD EPI equation. This calculation has not been validated in all clinical situations. eGFR's persistently <60 mL/min signify possible Chronic Kidney Disease.    Anion gap 11 5 - 15  CBC     Status: Abnormal   Collection Time: 11/15/14 12:06 PM  Result Value Ref Range   WBC 10.1 4.0 - 10.5 K/uL   RBC 2.88 (L) 3.87 - 5.11 MIL/uL   Hemoglobin 9.1 (L) 12.0 - 15.0 g/dL   HCT 27.5 (L) 36.0 - 46.0 %   MCV 95.5 78.0 - 100.0 fL   MCH 31.6 26.0 - 34.0 pg   MCHC  33.1 30.0 - 36.0 g/dL   RDW 14.1 11.5 - 15.5 %   Platelets 119 (L) 150 - 400 K/uL    Comment: SPECIMEN CHECKED FOR CLOTS REPEATED TO VERIFY PLATELET COUNT CONFIRMED BY SMEAR   Type and screen     Status: None   Collection Time: 11/15/14 12:06 PM  Result Value Ref Range   ABO/RH(D) O POS  Antibody Screen NEG    Sample Expiration 11/18/2014   Protime-INR     Status: Abnormal   Collection Time: 11/15/14 12:06 PM  Result Value Ref Range   Prothrombin Time 17.7 (H) 11.6 - 15.2 seconds   INR 1.45 0.00 - 1.49  Lipase, blood     Status: None   Collection Time: 11/15/14 12:06 PM  Result Value Ref Range   Lipase 28 22 - 51 U/L  POC occult blood, ED     Status: Abnormal   Collection Time: 11/15/14  2:02 PM  Result Value Ref Range   Fecal Occult Bld POSITIVE (A) NEGATIVE    No results found. ROS: Constitutional: chronic weakness and nausea. HEENT: negative negative Cardiovascular: negative Respiratory: sleep apnea stable with CPAP GI: as an present illness GU: history of previous urinary tract infections Musculoskeletal: fibromyalgia Neuro/Psychiatric: Endocrine/Heme: low thyroid and diabetes.            Blood pressure 114/39, pulse 105, temperature 98.2 F (36.8 C), temperature source Oral, resp. rate 18, SpO2 98 %.  Physical exam:   General-- obese white female in no acute distress ENT-- sclera nonicteric Neck-- obese neck generally nontender Heart-- regular rate and rhythm without murmurs are galloped Lungs-- clear Abdomen-- obese good bowel sounds and minimal tenderness. States that her abdomen hurts in the lower part of her abdomen.  Psych-- alert oriented inappropriate   Assessment: 1. Hematemesis. Patient does have cirrhosis and portal gastropathy but no significant varices on recent EGD. She is on NSAIDs and could be bleeding from erosion's ulceration gastropathy etc. 2. Cirrhosis. Secondary to alcohol abuse and NASH 3. Fibromyalgia. Apparently  requiring chronic NSAIDs 4. Diabetes with metabolic syndrome 5. OSA on CPAP  Plan: go ahead and place patient on IV PPI, would hold on somatostatin given the fact that she had no varices on recent EGD. If she does develop heavy bleeding would go ahead and start somatostatin. We will plan on EGD tomorrow approximately 2 o'clock. Have discussed with patient and her son about the possibility of varices bleeding and if so would attempt to place banding. They expressed understanding of this.   Riyana Biel JR,Hazyl Marseille L 11/15/2014, 3:37 PM   Pager: [phone] If no answer or after hours call [phone]

## 2014-11-15 NOTE — ED Notes (Addendum)
Per EMS. Pt was drinking ETOH on Friday and had 1 episode of hematemesis at that time. Felt unwell this weekend. Today had an episode of hematemesis at 1000 and threw up blood. Pt reports diffuse abd pain. Some brown diarrhea, no frank blood in stool.  zofran given by EMS

## 2014-11-15 NOTE — ED Notes (Signed)
Bed: WA17 Expected date:  Expected time:  Means of arrival:  Comments: EMS-vomiting blood 

## 2014-11-15 NOTE — ED Notes (Signed)
MD at bedside. 

## 2014-11-15 NOTE — ED Notes (Signed)
Unable to collect labs at this time MD is in the room talking with patient 

## 2014-11-16 ENCOUNTER — Inpatient Hospital Stay (HOSPITAL_COMMUNITY): Payer: Medicare Other | Admitting: Anesthesiology

## 2014-11-16 ENCOUNTER — Encounter (HOSPITAL_COMMUNITY)
Admission: EM | Disposition: A | Discharge status: Home Health | Payer: Self-pay | Source: Home / Self Care | Attending: Family Medicine

## 2014-11-16 ENCOUNTER — Encounter (HOSPITAL_COMMUNITY): Payer: Self-pay | Admitting: *Deleted

## 2014-11-16 HISTORY — PX: ESOPHAGOGASTRODUODENOSCOPY: SHX5428

## 2014-11-16 HISTORY — PX: GASTRIC VARICES BANDING: SHX5519

## 2014-11-16 LAB — COMPREHENSIVE METABOLIC PANEL
ALT: 27 U/L (ref 14–54)
AST: 56 U/L — ABNORMAL HIGH (ref 15–41)
Albumin: 2.6 g/dL — ABNORMAL LOW (ref 3.5–5.0)
Alkaline Phosphatase: 51 U/L (ref 38–126)
Anion gap: 3 — ABNORMAL LOW (ref 5–15)
BUN: 40 mg/dL — ABNORMAL HIGH (ref 6–20)
CO2: 26 mmol/L (ref 22–32)
CREATININE: 0.73 mg/dL (ref 0.44–1.00)
Calcium: 8.2 mg/dL — ABNORMAL LOW (ref 8.9–10.3)
Chloride: 108 mmol/L (ref 101–111)
GFR calc Af Amer: >60 mL/min (ref >60)
Glucose, Bld: 302 mg/dL — ABNORMAL HIGH (ref 65–99)
POTASSIUM: 4 mmol/L (ref 3.5–5.1)
Sodium: 137 mmol/L (ref 135–145)
TOTAL PROTEIN: 5.1 g/dL — AB (ref 6.5–8.1)
Total Bilirubin: 1.7 mg/dL — ABNORMAL HIGH (ref 0.3–1.2)

## 2014-11-16 LAB — CBC
HCT: 23.2 % — ABNORMAL LOW (ref 36.0–46.0)
HEMATOCRIT: 23.5 % — AB (ref 36.0–46.0)
HEMOGLOBIN: 7.7 g/dL — AB (ref 12.0–15.0)
Hemoglobin: 7.9 g/dL — ABNORMAL LOW (ref 12.0–15.0)
MCH: 31.7 pg (ref 26.0–34.0)
MCH: 32 pg (ref 26.0–34.0)
MCHC: 33.2 g/dL (ref 30.0–36.0)
MCHC: 33.6 g/dL (ref 30.0–36.0)
MCV: 95.5 fL (ref 78.0–100.0)
MCV: 95.1 fL (ref 78.0–100.0)
PLATELETS: 95 10*3/uL — AB (ref 150–400)
Platelets: 84 10*3/uL — ABNORMAL LOW (ref 150–400)
RBC: 2.43 MIL/uL — ABNORMAL LOW (ref 3.87–5.11)
RBC: 2.47 MIL/uL — ABNORMAL LOW (ref 3.87–5.11)
RDW: 14.7 % (ref 11.5–15.5)
RDW: 15.5 % (ref 11.5–15.5)
WBC: 8.3 10*3/uL (ref 4.0–10.5)
WBC: 6.9 10*3/uL (ref 4.0–10.5)

## 2014-11-16 LAB — GLUCOSE, CAPILLARY
GLUCOSE-CAPILLARY: 287 mg/dL — AB (ref 65–99)
GLUCOSE-CAPILLARY: 224 mg/dL — AB (ref 65–99)
Glucose-Capillary: 283 mg/dL — ABNORMAL HIGH (ref 65–99)
Glucose-Capillary: 263 mg/dL — ABNORMAL HIGH (ref 65–99)
Glucose-Capillary: 275 mg/dL — ABNORMAL HIGH (ref 65–99)
Glucose-Capillary: 236 mg/dL — ABNORMAL HIGH (ref 65–99)

## 2014-11-16 LAB — MAGNESIUM: MAGNESIUM: 1.4 mg/dL — AB (ref 1.7–2.4)

## 2014-11-16 LAB — PREPARE RBC (CROSSMATCH)

## 2014-11-16 LAB — POCT I-STAT 4, (NA,K, GLUC, HGB,HCT)
GLUCOSE: 234 mg/dL — AB (ref 65–99)
HCT: 21 % — ABNORMAL LOW (ref 36.0–46.0)
HEMOGLOBIN: 7.1 g/dL — AB (ref 12.0–15.0)
POTASSIUM: 4 mmol/L (ref 3.5–5.1)
Sodium: 142 mmol/L (ref 135–145)

## 2014-11-16 SURGERY — EGD (ESOPHAGOGASTRODUODENOSCOPY)
Anesthesia: Moderate Sedation

## 2014-11-16 SURGERY — EGD (ESOPHAGOGASTRODUODENOSCOPY)
Anesthesia: General

## 2014-11-16 MED ORDER — LIDOCAINE HCL (CARDIAC) 20 MG/ML IV SOLN
INTRAVENOUS | Status: AC
  Filled 2014-11-16: qty 5

## 2014-11-16 MED ORDER — FENTANYL CITRATE (PF) 100 MCG/2ML IJ SOLN
INTRAMUSCULAR | Status: DC | PRN
  Administered 2014-11-16: 50 ug via INTRAVENOUS

## 2014-11-16 MED ORDER — SODIUM CHLORIDE 0.9 % IV SOLN
INTRAVENOUS | Status: DC | PRN
  Administered 2014-11-16: 13:00:00 via INTRAVENOUS

## 2014-11-16 MED ORDER — PHENYLEPHRINE 40 MCG/ML (10ML) SYRINGE FOR IV PUSH (FOR BLOOD PRESSURE SUPPORT)
PREFILLED_SYRINGE | INTRAVENOUS | Status: AC
  Filled 2014-11-16: qty 10

## 2014-11-16 MED ORDER — LACTATED RINGERS IV SOLN
INTRAVENOUS | Status: DC | PRN
  Administered 2014-11-16: 13:00:00 via INTRAVENOUS

## 2014-11-16 MED ORDER — MAGNESIUM OXIDE 400 (241.3 MG) MG PO TABS
400.0000 mg | ORAL_TABLET | Freq: Every day | ORAL | Status: DC
  Administered 2014-11-16 – 2014-11-17 (×2): 400 mg via ORAL
  Filled 2014-11-16 (×2): qty 1

## 2014-11-16 MED ORDER — PROPOFOL 10 MG/ML IV BOLUS
INTRAVENOUS | Status: DC | PRN
  Administered 2014-11-16: 120 mg via INTRAVENOUS

## 2014-11-16 MED ORDER — FENTANYL CITRATE (PF) 100 MCG/2ML IJ SOLN
INTRAMUSCULAR | Status: AC
  Filled 2014-11-16: qty 2

## 2014-11-16 MED ORDER — PROPOFOL 10 MG/ML IV BOLUS
INTRAVENOUS | Status: AC
  Filled 2014-11-16: qty 20

## 2014-11-16 MED ORDER — LACTATED RINGERS IV SOLN
INTRAVENOUS | Status: DC | PRN
  Administered 2014-11-16: 12:00:00 via INTRAVENOUS

## 2014-11-16 MED ORDER — SUCCINYLCHOLINE CHLORIDE 20 MG/ML IJ SOLN
INTRAMUSCULAR | Status: DC | PRN
  Administered 2014-11-16: 50 mg via INTRAVENOUS
  Administered 2014-11-16: 100 mg via INTRAVENOUS

## 2014-11-16 MED ORDER — LIDOCAINE HCL (CARDIAC) 20 MG/ML IV SOLN
INTRAVENOUS | Status: DC | PRN
  Administered 2014-11-16: 100 mg via INTRAVENOUS

## 2014-11-16 NOTE — Transfer of Care (Signed)
Immediate Anesthesia Transfer of Care Note  Patient: Martha Wise  Procedure(s) Performed: Procedure(s): ESOPHAGOGASTRODUODENOSCOPY (EGD) (N/A) GASTRIC VARICES BANDING (N/A)  Patient Location: Endoscopy Unit  Anesthesia Type:General  Level of Consciousness: awake and alert   Airway & Oxygen Therapy: Patient Spontanous Breathing and Patient connected to face mask oxygen  Post-op Assessment: Report given to RN and Post -op Vital signs reviewed and stable  Post vital signs: Reviewed and stable  Last Vitals:  Filed Vitals:   11/16/14 1208  BP: 172/48  Pulse: 89  Temp: 36.8 C  Resp: 20    Complications: No apparent anesthesia complications

## 2014-11-16 NOTE — Anesthesia Procedure Notes (Signed)
Procedure Name: Intubation Date/Time: 11/16/2014 12:52 PM Performed by: Leroy Libman L Patient Re-evaluated:Patient Re-evaluated prior to inductionOxygen Delivery Method: Circle system utilized Preoxygenation: Pre-oxygenation with 100% oxygen Intubation Type: IV induction, Rapid sequence and Cricoid Pressure applied Laryngoscope Size: Miller and 2 Grade View: Grade I Tube type: Oral Tube size: 7.5 mm Number of attempts: 1 Airway Equipment and Method: Stylet Placement Confirmation: ETT inserted through vocal cords under direct vision,  breath sounds checked- equal and bilateral and positive ETCO2 Secured at: 20 cm Tube secured with: Tape Dental Injury: Teeth and Oropharynx as per pre-operative assessment

## 2014-11-16 NOTE — Op Note (Signed)
[facility] [address] [place], [address]   ENDOSCOPY PROCEDURE REPORT  PATIENT: Martha, Wise  MR#: [id number] BIRTHDATE: Nov 18, 1940 , 74  yrs. old GENDER: female ENDOSCOPIST:Deniese Oberry Randa Evens, MD REFERRED BY: Chilton Greathouse, M.D. PROCEDURE DATE:  11/16/2014 PROCEDURE:   EGD, diagnostic ASA CLASS:    Class IV INDICATIONS: history of hematemesis inpatient with known cirrhosis due to NASH and ethanol. MEDICATION: per anesthesia it was decided to intubate the patient and she was given general anesthesia. TOPICAL ANESTHETIC:  DESCRIPTION OF PROCEDURE:   After the risks and benefits of the procedure were explained, informed consent was obtained.  The endoscope was introduced through the mouth  and advanced to the second portion of the duodenum .  The instrument was slowly withdrawn as the mucosa was fully examined. Estimated blood loss is zero unless otherwise noted in this procedure report.      ESOPHAGUS: No varices, esophagitis or signs of bleeding.  STOMACH: No gastric varices, active bleeding or ulcerations.  DUODENUM: No ulcerations are active bleeding.    Retroflexed views revealed no abnormalities.    The scope was then withdrawn from the patient and the procedure completed.  COMPLICATIONS: There were no immediate complications.  ENDOSCOPIC IMPRESSION: 1.   No varices, esophagitis or signs of bleeding 2.   No gastric varices, active bleeding or ulcerations 3.   No ulcerations are active bleeding 4.    Hematemesis. No obvious source on EGD RECOMMENDATIONS: Would feed patient and have her follow-up in the office with Dr. Matthias Hughs.   _______________________________ eSigned:  Carman Ching, MD 11/16/2014 1:35 PM     cc: Chilton Greathouse, MD  CPT CODES: ICD CODES:  The ICD and CPT codes recommended by this software are interpretations from the data that the clinical staff has captured with the software.  The verification of the  translation of this report to the ICD and CPT codes and modifiers is the sole responsibility of the health care institution and practicing physician where this report was generated.  [employer] will not be held responsible for the validity of the ICD and CPT codes included on this report.  AMA assumes no liability for data contained or not contained herein. CPT is a Publishing rights manager of the [employer].

## 2014-11-16 NOTE — Care Management Note (Signed)
Case Management Note  Patient Details  Name: ANETHA SLAGEL MRN: [id number] Date of Birth: December 28, 1940  Subjective/Objective:  Transfer from SDU. UGIB, cirrhosis, esophageal varices.From home.                  Action/Plan:d/c plan home.No anticipated d/c needs.   Expected Discharge Date:   (unknown)               Expected Discharge Plan:  Home/Self Care  In-House Referral:  NA  Discharge planning Services  CM Consult  Post Acute Care Choice:  NA Choice offered to:  NA  DME Arranged:  N/A DME Agency:  NA  HH Arranged:  NA HH Agency:  NA  Status of Service:  In process, will continue to follow  Medicare Important Message Given:    Date Medicare IM Given:    Medicare IM give by:    Date Additional Medicare IM Given:    Additional Medicare Important Message give by:     If discussed at Long Length of Stay Meetings, dates discussed:    Additional Comments:  [name], RN 11/16/2014, 12:48 PM

## 2014-11-16 NOTE — H&P (View-Only) (Signed)
[facility] Reason for consult: Hematemesis inpatient with known cirrhosis Referring Physician: [facility]. PCP: Dr. Avva. Primary G.I.: Dr. Buccini  Martha Wise is an 74 y.o. female.  HPI: has a long history of liver disease. She has been followed by Dr. Buccini for some time for chronic liver disease with cirrhosis suggested by imaging studies. 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 The patient has a history of excessive ethanol consumption and states that she has cut down now to 2 bottles of wine daily. EGD's in the past that suggested possible varices, however, EGD by Dr. Buccini 11/15 showed no obvious esophageal varices and possibly type I minimal gastric varices with no bleeding. 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 The patient has continued to drink. In addition she has a history of fibromyalgia has been on chronic NSAIDs for some time as well. She has taken both Naprosyn as well as ibuprofen in the past. She had been doing reasonably well up until about 3 days ago when she began to feel weak and have lower abdominal pain. She states that she began to have hematemesis of small amounts of bloody emesis. She states that the abdominal pain is in her lower abdomen. She denies taking any other over-the-counter NSAIDs. Her other chronic problems include hypothyroidism, type II diabetes, fibromyalgia, asthma, obstructive sleep apnea. She uses CPAP. She has had previous colon polyps removed in the past. PT is normally about 12.0 in total bilirubin 1.8 to 2 with albumin about 3.5.  Past Medical History  Diagnosis Date  . OSA (obstructive sleep apnea)   . Asthma   . Hypertension   . Diabetes   . Allergic rhinitis   . Fibromyalgia   . Alcoholic cirrhosis   . Esophageal varices   . Hypothyroid     Past Surgical History  Procedure Laterality Date  .  Abdominal hysterectomy    . Bladder tact      Family History  Problem Relation Age of Onset  . Allergies Mother   . Asthma Mother     children   . Heart disease      father side of family  . Cancer Mother     Social History:  reports that she has never smoked. She has never used smokeless tobacco. She reports that she drinks alcohol. She reports that she does not use illicit drugs.  Allergies:  Allergies  Allergen Reactions  . Phenergan [Promethazine Hcl] Swelling    Lip swelling  . Hydrocodone Other (See Comments)    Gi upset/ abdominal pain  . Glimepiride Anxiety    Caused extreme anxiety      Medications; Prior to Admission medications   Medication Sig Start Date End Date Taking? Authorizing Provider  aspirin 81 MG tablet Take 81 mg by mouth every morning.    Yes Historical Provider, MD  B-D ULTRAFINE III SHORT PEN 31G X 8 MM MISC  06/22/14  Yes Historical Provider, MD  Carboxymethylcellul-Glycerin (REFRESH OPTIVE) 1-0.9 % GEL Apply 1-2 drops to eye daily as needed.   Yes Historical Provider, MD  cholecalciferol (VITAMIN D) 1000 UNITS tablet Take 3,000 Units by mouth every other day.    Yes Historical Provider, MD  docusate sodium (COLACE) 100 MG capsule Take 100 mg by mouth every evening.   Yes Historical Provider, MD  FLUoxetine (PROZAC) 20 MG capsule Take 20 mg by mouth every evening.      Yes Historical Provider, MD  HUMALOG KWIKPEN 100 UNIT/ML KiwkPen Inject 20 Units as directed 3 (three) times daily. 11/02/14  Yes Historical Provider, MD  LEVEMIR FLEXTOUCH 100 UNIT/ML Pen Inject 80 Units as directed 2 (two) times daily. 11/03/14  Yes Historical Provider, MD  metFORMIN (GLUCOPHAGE-XR) 500 MG 24 hr tablet Take 1,000 mg by mouth daily with breakfast. 2 tablets in the am 01/17/12  Yes Historical Provider, MD  Naproxen Sodium (ALEVE) 220 MG CAPS Take 220 mg by mouth 2 (two) times daily as needed.   Yes Historical Provider, MD  ONE TOUCH ULTRA TEST test strip  06/16/14  Yes  Historical Provider, MD  SYNTHROID 137 MCG tablet  06/15/14  Yes Historical Provider, MD  valsartan (DIOVAN) 320 MG tablet Take 320 mg by mouth every morning.    Yes Historical Provider, MD  fluticasone (FLONASE) 50 MCG/ACT nasal spray Place into both nostrils daily.    Historical Provider, MD  Fluticasone-Salmeterol (ADVAIR DISKUS) 250-50 MCG/DOSE AEPB take 1 puff by mouth twice a day during the winter 04/17/13   Melissa Smith, PA-C   . [START ON 11/19/2014] pantoprazole (PROTONIX) IV  40 mg Intravenous Q12H   PRN Meds  Results for orders placed or performed during the hospital encounter of 11/15/14 (from the past 48 hour(s))  ABO/Rh     Status: None   Collection Time: 11/15/14 11:59 AM  Result Value Ref Range   ABO/RH(D) O POS   Comprehensive metabolic panel     Status: Abnormal   Collection Time: 11/15/14 12:06 PM  Result Value Ref Range   Sodium 134 (L) 135 - 145 mmol/L   Potassium 4.5 3.5 - 5.1 mmol/L   Chloride 102 101 - 111 mmol/L   CO2 21 (L) 22 - 32 mmol/L   Glucose, Bld 410 (H) 65 - 99 mg/dL   BUN 38 (H) 6 - 20 mg/dL   Creatinine, Ser 0.85 0.44 - 1.00 mg/dL   Calcium 9.1 8.9 - 10.3 mg/dL   Total Protein 5.4 (L) 6.5 - 8.1 g/dL   Albumin 2.6 (L) 3.5 - 5.0 g/dL   AST 58 (H) 15 - 41 U/L   ALT 30 14 - 54 U/L   Alkaline Phosphatase 63 38 - 126 U/L   Total Bilirubin 2.2 (H) 0.3 - 1.2 mg/dL   GFR calc non Af Amer >60 >60 mL/min   GFR calc Af Amer >60 >60 mL/min    Comment: (NOTE) The eGFR has been calculated using the CKD EPI equation. This calculation has not been validated in all clinical situations. eGFR's persistently <60 mL/min signify possible Chronic Kidney Disease.    Anion gap 11 5 - 15  CBC     Status: Abnormal   Collection Time: 11/15/14 12:06 PM  Result Value Ref Range   WBC 10.1 4.0 - 10.5 K/uL   RBC 2.88 (L) 3.87 - 5.11 MIL/uL   Hemoglobin 9.1 (L) 12.0 - 15.0 g/dL   HCT 27.5 (L) 36.0 - 46.0 %   MCV 95.5 78.0 - 100.0 fL   MCH 31.6 26.0 - 34.0 pg   MCHC  33.1 30.0 - 36.0 g/dL   RDW 14.1 11.5 - 15.5 %   Platelets 119 (L) 150 - 400 K/uL    Comment: SPECIMEN CHECKED FOR CLOTS REPEATED TO VERIFY PLATELET COUNT CONFIRMED BY SMEAR   Type and screen     Status: None   Collection Time: 11/15/14 12:06 PM  Result Value Ref Range   ABO/RH(D) O POS      Antibody Screen NEG    Sample Expiration 11/18/2014   Protime-INR     Status: Abnormal   Collection Time: 11/15/14 12:06 PM  Result Value Ref Range   Prothrombin Time 17.7 (H) 11.6 - 15.2 seconds   INR 1.45 0.00 - 1.49  Lipase, blood     Status: None   Collection Time: 11/15/14 12:06 PM  Result Value Ref Range   Lipase 28 22 - 51 U/L  POC occult blood, ED     Status: Abnormal   Collection Time: 11/15/14  2:02 PM  Result Value Ref Range   Fecal Occult Bld POSITIVE (A) NEGATIVE    No results found. ROS: Constitutional: chronic weakness and nausea. HEENT: negative negative Cardiovascular: negative Respiratory: sleep apnea stable with CPAP GI: as an present illness GU: history of previous urinary tract infections Musculoskeletal: fibromyalgia Neuro/Psychiatric: Endocrine/Heme: low thyroid and diabetes.            Blood pressure 114/39, pulse 105, temperature 98.2 F (36.8 C), temperature source Oral, resp. rate 18, SpO2 98 %.  Physical exam:   General-- obese white female in no acute distress ENT-- sclera nonicteric Neck-- obese neck generally nontender Heart-- regular rate and rhythm without murmurs are galloped Lungs-- clear Abdomen-- obese good bowel sounds and minimal tenderness. States that her abdomen hurts in the lower part of her abdomen.  Psych-- alert oriented inappropriate   Assessment: 1. Hematemesis. Patient does have cirrhosis and portal gastropathy but no significant varices on recent EGD. She is on NSAIDs and could be bleeding from erosion's ulceration gastropathy etc. 2. Cirrhosis. Secondary to alcohol abuse and NASH 3. Fibromyalgia. Apparently  requiring chronic NSAIDs 4. Diabetes with metabolic syndrome 5. OSA on CPAP  Plan: go ahead and place patient on IV PPI, would hold on somatostatin given the fact that she had no varices on recent EGD. If she does develop heavy bleeding would go ahead and start somatostatin. We will plan on EGD tomorrow approximately 2 o'clock. Have discussed with patient and her son about the possibility of varices bleeding and if so would attempt to place banding. They expressed understanding of this.   Maxamus Colao JR,Javonne Louissaint L 11/15/2014, 3:37 PM   Pager: [phone] If no answer or after hours call [phone]     

## 2014-11-16 NOTE — Progress Notes (Signed)
Resumed care of previous patient. Agree with previous assessment. Will continue to monitor.

## 2014-11-16 NOTE — Interval H&P Note (Signed)
History and Physical Interval Note:  11/16/2014 12:38 PM  Martha Wise  has presented today for surgery, with the diagnosis of gib  The various methods of treatment have been discussed with the patient and family. After consideration of risks, benefits and other options for treatment, the patient has consented to  Procedure(s): ESOPHAGOGASTRODUODENOSCOPY (EGD) (N/A) GASTRIC VARICES BANDING (N/A) as a surgical intervention .  The patient's history has been reviewed, patient examined, no change in status, stable for surgery.  I have reviewed the patient's chart and labs.  Questions were answered to the patient's satisfaction.     [place] Monds JR,Icyss Skog L

## 2014-11-16 NOTE — Anesthesia Postprocedure Evaluation (Signed)
  Anesthesia Post-op Note  Patient: Martha Wise  Procedure(s) Performed: Procedure(s): ESOPHAGOGASTRODUODENOSCOPY (EGD) (N/A) GASTRIC VARICES BANDING (N/A)  Patient Location: Endoscopy Unit  Anesthesia Type:General  Level of Consciousness: awake, alert , oriented and patient cooperative  Airway and Oxygen Therapy: Patient Spontanous Breathing and Patient connected to nasal cannula oxygen  Post-op Pain: none  Post-op Assessment: Post-op Vital signs reviewed, Patient's Cardiovascular Status Stable, Respiratory Function Stable, Patent Airway, No signs of Nausea or vomiting and Pain level controlled              Post-op Vital Signs: Reviewed and stable  Last Vitals:  Filed Vitals:   11/16/14 1420  BP: 129/32  Pulse: 83  Temp: 36.7 C  Resp: 20    Complications: No apparent anesthesia complications

## 2014-11-16 NOTE — Progress Notes (Signed)
Martha Wise NWG:[id number] DOB: [birth year] DOA: 11/15/2014 PCP: Hoyle Sauer, MD  Brief narrative: 74 y/o ? OSA on CPAP diagnosed 2002, fibromyalgia Rx Dr. Kellie Simmering on Advil 4 tablets daily as well as muscle relaxants (takes ibuprofen tablets one only), cirrhosis and stable varices-supposed to have last endoscopy 02/19/2014 but did not get this, chronic daily drinker for the past 30 years of 2 x750 mL bottles Chardonnay, metabolic syndrome There is no weight on file to calculate BMI. , Incarcerated ventral incisional hernia 2005 Started to have nausea vomiting and discomfort in the abdomen on 11/12/14 She states that she felt nauseous on that evening and then proceeded to have one episode of bloody emesis and also felt diaphoretic and weak She decided to take rest at home did not have any further recurrence until 10/2514 where she had 1 large bright red bloody emesis at around 10 AM and then another large emesis just before the ambulance came to pick her up thoguht to have upper GI/Vareiceal bleed See reports of Dr. Randa Evens on initial consult  Past medical history-As per Problem list Chart reviewed as below-   Consultants:  Dr. Randa Evens GI  Procedures:    Antibiotics:     Subjective  Well 2-3 episodes small blood clots stool overnight No bloody emesis No witdrawal sympotms No cp Daughter +   Objective    Interim History: Fair night per RN  Telemetry: Sinus/sinus tach   Objective: Filed Vitals:   11/16/14 0355 11/16/14 0500 11/16/14 0600 11/16/14 0700  BP:  148/31 122/40 117/30  Pulse:  98 96 93  Temp: 97.9 F (36.6 C)     TempSrc: Oral     Resp:  Height:      Weight:      SpO2:  99% 97% 96%    Intake/Output Summary (Last 24 hours) at 11/16/14 0739 Last data filed at 11/16/14 0700  Gross per 24 hour  Intake 1838.75 ml  Output    650 ml  Net 1188.75 ml    Exam:  Eomi, No pallor s1 s2 slightly tachy-RRR Abd obese with striae and  diasstasis, mild hepatolmegally, no CVA tender Hemoccult and rectal deferred  Data Reviewed: Basic Metabolic Panel:  Recent Labs Lab 11/15/14 1206 11/16/14 0450  NA 134* 137  K 4.5 4.0  CL 102 108  CO2 21* 26  GLUCOSE 410* 302*  BUN 38* 40*  CREATININE 0.85 0.73  CALCIUM 9.1 8.2*   Liver Function Tests:  Recent Labs Lab 11/15/14 1206 11/16/14 0450  AST 58* 56*  ALT 30 27  ALKPHOS 63 51  BILITOT 2.2* 1.7*  PROT 5.4* 5.1*  ALBUMIN 2.6* 2.6*    Recent Labs Lab 11/15/14 1206  LIPASE 28   No results for input(s): AMMONIA in the last 168 hours. CBC:  Recent Labs Lab 11/15/14 1206 11/15/14 1745 11/16/14 0450  WBC 10.1 9.5 8.3  HGB 9.1* 8.3* 7.7*  HCT 27.5* 25.0* 23.2*  MCV 95.5 95.4 95.5  PLT 119* 117* 95*   Cardiac Enzymes: No results for input(s): CKTOTAL, CKMB, CKMBINDEX, TROPONINI in the last 168 hours. BNP: Invalid input(s): POCBNP CBG:  Recent Labs Lab 11/15/14 1804 11/15/14 1959 11/15/14 2321 11/16/14 0354  GLUCAP 371* 350* 287* 283*    Recent Results (from the past 240 hour(s))  MRSA PCR Screening     Status: None   Collection Time: 11/15/14 11:18 AM  Result Value Ref Range Status   MRSA by PCR NEGATIVE NEGATIVE Final  Comment:        The GeneXpert MRSA Assay (FDA approved for NASAL specimens only), is one component of a comprehensive MRSA colonization surveillance program. It is not intended to diagnose MRSA infection nor to guide or monitor treatment for MRSA infections.      Studies:              All Imaging reviewed and is as per above notation   Scheduled Meds: . antiseptic oral rinse  7 mL Mouth Rinse q12n4p  . chlorhexidine  15 mL Mouth Rinse BID  . FLUoxetine  20 mg Oral QPM  . insulin aspart  0-15 Units Subcutaneous 6 times per day  . insulin detemir  35 Units Subcutaneous BID  . levothyroxine  137 mcg Oral QAC breakfast  . LORazepam  1 mg Oral QID   Followed by  . [START ON 11/17/2014] LORazepam  1 mg Oral  TID   Followed by  . [START ON 11/18/2014] LORazepam  1 mg Oral BID   Followed by  . [START ON 11/19/2014] LORazepam  1 mg Oral Daily  . multivitamin with minerals  1 tablet Oral Daily  . [START ON 11/19/2014] pantoprazole (PROTONIX) IV  40 mg Intravenous Q12H  . sodium chloride  3 mL Intravenous Q12H  . thiamine  100 mg Oral Daily   Continuous Infusions: . sodium chloride 1,000 mL (11/15/14 1643)  . pantoprozole (PROTONIX) infusion 8 mg/hr (11/16/14 0112)     Assessment/Plan:  Assessment/plan  Hypovolemic/distributive shock -resolved-hemodyn stable for telel bed now as no further large bleed, Hg marginally decreased -Keep 2 large-bore IVs open  Hematemesis secondary to likely bleeding varices -2/2 to Naprocen and aspirin in setting heavy ETOH -Protonix PPI and admitted to step down -CBC every 12t -Okay to give essential meds such as Synthroid 137 every morning, Prozac 20 every afternoon otherwise hold all other medications including metformin 1000 twice a day, Diovan 320 AM -Likely will need endoscopy and potential ligation?  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  Fibromyalgia reported- -We'll need other options and non-pharmacological management -Could do with losing some weight? -long discussion about fibromyalgia and weight loss and exercise as well as fatty liver--she understands the take home points about cutting down drinking as well as physical  activtiy to reduce somatic sympotms  Obstructive sleep apnea - BiPAP qhs  Hypothyroidism -Continue Synthroid 137 daily   Code Status: full Family Communication:  Long discussion with daughter Disposition Plan: inpatient-tx to tele   Pleas Koch, MD  [facility] Pager [phone] 11/16/2014, 7:39 AM    LOS: 1 day

## 2014-11-16 NOTE — Care Management Note (Signed)
Case Management Note  Patient Details  Name: Martha Wise MRN: [id number] Date of Birth: 1941/01/02  Subjective/Objective:                 Lower gi bleed   Action/Plan: Date:  November 16, 2014 U.R. performed for needs and level of care. Will continue to follow for Case Management needs.  Marcelle Smiling, RN, BSN, [place]   [phone]  Expected Discharge Date:   (unknown)               Expected Discharge Plan:  Home/Self Care  In-House Referral:  NA  Discharge planning Services  CM Consult  Post Acute Care Choice:  NA Choice offered to:  NA  DME Arranged:  N/A DME Agency:  NA  HH Arranged:  NA HH Agency:  NA  Status of Service:  In process, will continue to follow  Medicare Important Message Given:    Date Medicare IM Given:    Medicare IM give by:    Date Additional Medicare IM Given:    Additional Medicare Important Message give by:     If discussed at Long Length of Stay Meetings, dates discussed:    Additional Comments:  Golda Acre, RN 11/16/2014, 11:03 AM

## 2014-11-16 NOTE — Anesthesia Preprocedure Evaluation (Addendum)
Anesthesia Evaluation  Patient identified by MRN, date of birth, ID band Patient awake    Reviewed: Allergy & Precautions, NPO status , Patient's Chart, lab work & pertinent test results  History of Anesthesia Complications Negative for: history of anesthetic complications  Airway Mallampati: III  TM Distance: >3 FB Neck ROM: Full    Dental  (+) Dental Advisory Given   Pulmonary sleep apnea and Continuous Positive Airway Pressure Ventilation , COPD COPD inhaler,  breath sounds clear to auscultation        Cardiovascular hypertension, Pt. on medications - anginaRhythm:Regular Rate:Normal     Neuro/Psych negative neurological ROS     GI/Hepatic (+) Cirrhosis -  Esophageal Varices  substance abuse  alcohol use,   Endo/Other  diabetes (glu 263), Insulin Dependent, Oral Hypoglycemic AgentsHypothyroidism Morbid obesity  Renal/GU      Musculoskeletal  (+) Fibromyalgia -  Abdominal (+) + obese,   Peds  Hematology  (+) Blood dyscrasia (Hb 7.7, plt 95K, INR 1.45), ,   Anesthesia Other Findings   Reproductive/Obstetrics                            Anesthesia Physical Anesthesia Plan  ASA: III  Anesthesia Plan: General   Post-op Pain Management:    Induction: Intravenous  Airway Management Planned: Oral ETT  Additional Equipment:   Intra-op Plan:   Post-operative Plan: Extubation in OR  Informed Consent: I have reviewed the patients History and Physical, chart, labs and discussed the procedure including the risks, benefits and alternatives for the proposed anesthesia with the patient or authorized representative who has indicated his/her understanding and acceptance.   Dental advisory given  Plan Discussed with: CRNA and Surgeon  Anesthesia Plan Comments: (Plan routine monitors, GETA Probable transfusion)        Anesthesia Quick Evaluation

## 2014-11-17 ENCOUNTER — Encounter (HOSPITAL_COMMUNITY): Payer: Self-pay | Admitting: Gastroenterology

## 2014-11-17 DIAGNOSIS — D62 Acute posthemorrhagic anemia: Secondary | ICD-10-CM

## 2014-11-17 DIAGNOSIS — K922 Gastrointestinal hemorrhage, unspecified: Secondary | ICD-10-CM

## 2014-11-17 DIAGNOSIS — F10230 Alcohol dependence with withdrawal, uncomplicated: Secondary | ICD-10-CM

## 2014-11-17 LAB — CBC
HCT: 24.8 % — ABNORMAL LOW (ref 36.0–46.0)
HCT: 24.3 % — ABNORMAL LOW (ref 36.0–46.0)
HEMOGLOBIN: 7.8 g/dL — AB (ref 12.0–15.0)
HEMOGLOBIN: 8 g/dL — AB (ref 12.0–15.0)
MCH: 29.8 pg (ref 26.0–34.0)
MCH: 31.5 pg (ref 26.0–34.0)
MCHC: 31.5 g/dL (ref 30.0–36.0)
MCHC: 32.9 g/dL (ref 30.0–36.0)
MCV: 94.7 fL (ref 78.0–100.0)
MCV: 95.7 fL (ref 78.0–100.0)
PLATELETS: 92 10*3/uL — AB (ref 150–400)
Platelets: 82 10*3/uL — ABNORMAL LOW (ref 150–400)
RBC: 2.62 MIL/uL — AB (ref 3.87–5.11)
RBC: 2.54 MIL/uL — AB (ref 3.87–5.11)
RDW: 15.9 % — ABNORMAL HIGH (ref 11.5–15.5)
RDW: 16 % — AB (ref 11.5–15.5)
WBC: 6.9 10*3/uL (ref 4.0–10.5)
WBC: 5.6 10*3/uL (ref 4.0–10.5)

## 2014-11-17 LAB — BASIC METABOLIC PANEL
Anion gap: 6 (ref 5–15)
BUN: 23 mg/dL — ABNORMAL HIGH (ref 6–20)
CHLORIDE: 107 mmol/L (ref 101–111)
CO2: 25 mmol/L (ref 22–32)
CREATININE: 0.73 mg/dL (ref 0.44–1.00)
Calcium: 7.9 mg/dL — ABNORMAL LOW (ref 8.9–10.3)
GFR calc Af Amer: >60 mL/min (ref >60)
GFR calc non Af Amer: >60 mL/min (ref >60)
Glucose, Bld: 222 mg/dL — ABNORMAL HIGH (ref 65–99)
POTASSIUM: 3.4 mmol/L — AB (ref 3.5–5.1)
SODIUM: 138 mmol/L (ref 135–145)

## 2014-11-17 LAB — GLUCOSE, CAPILLARY
GLUCOSE-CAPILLARY: 148 mg/dL — AB (ref 65–99)
GLUCOSE-CAPILLARY: 145 mg/dL — AB (ref 65–99)
Glucose-Capillary: 84 mg/dL (ref 65–99)
Glucose-Capillary: 200 mg/dL — ABNORMAL HIGH (ref 65–99)

## 2014-11-17 MED ORDER — FERROUS SULFATE 325 (65 FE) MG PO TABS: 325.0000 mg | ORAL_TABLET | Freq: Every day | ORAL | Status: AC

## 2014-11-17 MED ORDER — POTASSIUM CHLORIDE CRYS ER 20 MEQ PO TBCR
40.0000 meq | EXTENDED_RELEASE_TABLET | Freq: Once | ORAL | Status: AC
  Administered 2014-11-17: 40 meq via ORAL
  Filled 2014-11-17: qty 2

## 2014-11-17 NOTE — Discharge Summary (Signed)
Physician Discharge Summary  Martha Wise GNF:[id number] DOB: 1940-05-17 DOA: 11/15/2014  PCP: Hoyle Sauer, MD  Admit date: 11/15/2014 Discharge date: 11/17/2014  Time spent: 60 minutes  Recommendations for Outpatient Follow-up:  1. Repeat CBC next week to check hemoglobin and platelets 2. Home health PT and RN ordered 3. No NSAIDs for now  Discharge Condition: stable Diet recommendation: low sodium, heart healthy  Discharge Diagnoses:  Principal Problem:   Hypovolemic shock/ acute upper GI bleed Active Problems: Thrombocytopeina   OSA (obstructive sleep apnea)   Morbid obesity with body mass index of 45.0-49.9 in adult   EtOH dependence   Cirrhosis with alcoholism  Anemia due to acute blood loss   History of present illness:  This is 74 year old female who was a history of cirrhosis secondary to NASH and alcohol use. She also has a history of obstructive sleep apnea, diabetes, hypertension, fibromyalgia and hypothyroidism. Her to the hospital for hematemesis. She was having pain across her lower abdomen as well.   Hospital Course:  Hematemesis/ hypovolemic shock -multiple episodes of bloody vomiting at home hematemesis has resolved but unfortunately source has not been found-EGD performed on 7/26 by Dr. Marin Comment report below -essentially no signs of bleeding noted --She takes a baby aspirin daily and naproxen occasionally- GI has not recommended any medication changes to be made however, I have asked her to hold her aspirin for now at least until she sees her PCP-she is to avoid other NSAIDs as well -Dr. Randa Evens recommended for her to follow-up with Dr. Matthias Hughs as outpatient and does not plan on doing any further procedures for her in the hospital  Anemia due to acute blood loss -Hemoglobin was 9.1 on admission and dropped to 7.1 on 7/26 -Was transfused 1 unit of packed red blood cells for above-mentioned hemoglobin which is now 8.0 -I will prescribe iron for  her as well to help her rebuild hemoglobin- start off as daily and increase to BID in 1 wk (with meals)-continue Colace at bedtime to prevent constipation  Thrombocytopenia -Do not have old lab work to compare with - the platelets were 119 on admission and dropped to 90s and have been stable in the 80-90 range- drop could be due to acute blood loss -Recommend repeat CBC in 1 week  Alcohol abuse - has been counseled to stop  -Developed mild withdrawal symptoms with tremor and anxiety as noted on the CIWA scale- has not had any Ativan in greater than 24 hours-no further symptoms of withdrawal noted -Her daughter is asking for some sort of medication to be given to her to prevent the patient from craving alcohol-at this point I am not recommending any medications for this, specifically, not benzodiazepine's for this elderly patient   Procedures: 7/27 QIO:[id number] IMPRESSION: 1. No varices, esophagitis or signs of bleeding 2. No gastric varices, active bleeding or ulcerations 3. No ulcerations are active bleeding 4. Hematemesis. No obvious source on EGD RECOMMENDATIONS: Would feed patient and have her follow-up in the office with Dr. Matthias Hughs.    Consultations:  GI  Discharge Exam: Filed Weights   11/15/14 1800 11/16/14 0920 11/16/14 1208  Weight: 123.6 kg (272 lb 7.8 oz) 124.603 kg (274 lb 11.2 oz) 124.286 kg (274 lb)   Filed Vitals:   11/17/14 0500  BP: 132/44  Pulse: 78  Temp: 98.3 F (36.8 C)  Resp: 18    General: AAO x 3, no distress Cardiovascular: RRR, no murmurs  Respiratory: clear to auscultation bilaterally GI: soft, non-tender, non-distended,  bowel sound positive  Discharge Instructions You were cared for by a hospitalist during your hospital stay. If you have any questions about your discharge medications or the care you received while you were in the hospital after you are discharged, you can call the unit and asked to speak with the hospitalist on  call if the hospitalist that took care of you is not available. Once you are discharged, your primary care physician will handle any further medical issues. Please note that NO REFILLS for any discharge medications will be authorized once you are discharged, as it is imperative that you return to your primary care physician (or establish a relationship with a primary care physician if you do not have one) for your aftercare needs so that they can reassess your need for medications and monitor your lab values.      Discharge Instructions    Discharge instructions    Complete by:  As directed   Please speak with your family doctor about whether or not you need to resume aspirin Avoid ibuprofen, Motrin, Aleve for now Avoid alcohol Take a low sodium heart healthy diabetic diet     Increase activity slowly    Complete by:  As directed             Medication List    STOP taking these medications        ALEVE 220 MG Caps  Generic drug:  Naproxen Sodium     aspirin 81 MG tablet      TAKE these medications        ADVAIR DISKUS 250-50 MCG/DOSE Aepb  Generic drug:  Fluticasone-Salmeterol  take 1 puff by mouth twice a day during the winter     B-D ULTRAFINE III SHORT PEN 31G X 8 MM Misc  Generic drug:  Insulin Pen Needle     cholecalciferol 1000 UNITS tablet  Commonly known as:  VITAMIN D  Take 3,000 Units by mouth every other day.     docusate sodium 100 MG capsule  Commonly known as:  COLACE  Take 100 mg by mouth every evening.     ferrous sulfate 325 (65 FE) MG tablet  Commonly known as:  FERROUSUL  Take 1 tablet (325 mg total) by mouth daily with breakfast.     FLUoxetine 20 MG capsule  Commonly known as:  PROZAC  Take 20 mg by mouth every evening.     fluticasone 50 MCG/ACT nasal spray  Commonly known as:  FLONASE  Place into both nostrils daily.     HUMALOG KWIKPEN 100 UNIT/ML KiwkPen  Generic drug:  insulin lispro  Inject 20 Units as directed 3 (three) times  daily.     LEVEMIR FLEXTOUCH 100 UNIT/ML Pen  Generic drug:  Insulin Detemir  Inject 80 Units as directed 2 (two) times daily.     metFORMIN 500 MG 24 hr tablet  Commonly known as:  GLUCOPHAGE-XR  Take 1,000 mg by mouth daily with breakfast. 2 tablets in the am     ONE TOUCH ULTRA TEST test strip  Generic drug:  glucose blood     REFRESH OPTIVE 1-0.9 % Gel  Generic drug:  Carboxymethylcellul-Glycerin  Apply 1-2 drops to eye daily as needed.     SYNTHROID 137 MCG tablet  Generic drug:  levothyroxine     valsartan 320 MG tablet  Commonly known as:  DIOVAN  Take 320 mg by mouth every morning.       Allergies  Allergen Reactions  .  Phenergan [Promethazine Hcl] Swelling    Lip swelling  . Hydrocodone Other (See Comments)    Gi upset/ abdominal pain  . Glimepiride Anxiety    Caused extreme anxiety     Follow-up Information    Follow up with Hoyle Sauer, MD. Go in 1 week.   Specialty:  Internal Medicine   Why:  Has appointment on Wed. Aug.3rd at 9:30 with stephanie NP; will recieve call tomorrow to see how patient is doing.   Contact information:   [address] [place] [address] [phone]       Follow up with [facility].   Why:  HHPT   Contact information:   [address] [place] [address] [phone]       Follow up with [facility].   Why:  rolling walker   Contact information:   [address] [place] [address] [phone]        The results of significant diagnostics from this hospitalization (including imaging, microbiology, ancillary and laboratory) are listed below for reference.    Significant Diagnostic Studies: No results found.  Microbiology: Recent Results (from the past 240 hour(s))  MRSA PCR Screening     Status: None   Collection Time: 11/15/14 11:18 AM  Result Value Ref Range Status   MRSA by PCR NEGATIVE NEGATIVE Final    Comment:        The GeneXpert MRSA  Assay (FDA approved for NASAL specimens only), is one component of a comprehensive MRSA colonization surveillance program. It is not intended to diagnose MRSA infection nor to guide or monitor treatment for MRSA infections.      Labs: Basic Metabolic Panel:  Recent Labs Lab 11/15/14 1206 11/16/14 0450 11/16/14 1235 11/17/14 1104  NA 134* 137 142 138  K 4.5 4.0 4.0 3.4*  CL 102 108  --  107  CO2 21* 26  --  25  GLUCOSE 410* 302* 234* 222*  BUN 38* 40*  --  23*  CREATININE 0.85 0.73  --  0.73  CALCIUM 9.1 8.2*  --  7.9*  MG  --  1.4*  --   --    Liver Function Tests:  Recent Labs Lab 11/15/14 1206 11/16/14 0450  AST 58* 56*  ALT 30 27  ALKPHOS 63 51  BILITOT 2.2* 1.7*  PROT 5.4* 5.1*  ALBUMIN 2.6* 2.6*    Recent Labs Lab 11/15/14 1206  LIPASE 28   No results for input(s): AMMONIA in the last 168 hours. CBC:  Recent Labs Lab 11/15/14 1745 11/16/14 0450 11/16/14 1235 11/16/14 1722 11/17/14 0529 11/17/14 1104  WBC 9.5 8.3  --  6.9 6.9 5.6  HGB 8.3* 7.7* 7.1* 7.9* 7.8* 8.0*  HCT 25.0* 23.2* 21.0* 23.5* 24.8* 24.3*  MCV 95.4 95.5  --  95.1 94.7 95.7  PLT 117* 95*  --  84* 92* 82*   Cardiac Enzymes: No results for input(s): CKTOTAL, CKMB, CKMBINDEX, TROPONINI in the last 168 hours. BNP: BNP (last 3 results) No results for input(s): BNP in the last 8760 hours.  ProBNP (last 3 results) No results for input(s): PROBNP in the last 8760 hours.  CBG:  Recent Labs Lab 11/16/14 2019 11/17/14 0124 11/17/14 0456 11/17/14 0746 11/17/14 1135  GLUCAP 236* 148* 84 145* 200*       SignedCalvert Cantor, MD [facility] 11/17/2014, 1:09 PM

## 2014-11-17 NOTE — Evaluation (Signed)
Physical Therapy Evaluation Patient Details Name: Martha Wise MRN: [id number] DOB: [birth year] Today's Date: 11/17/2014   History of Present Illness  74 year old female hx of OSA on CPAP, diabetes, hypertension, fibromyalgia admitted for hypovolemic shock and hematemesis secondary to likely bleeding varices  Clinical Impression  Pt admitted with above diagnosis. Pt currently with functional limitations due to the deficits listed below (see PT Problem List).  Pt will benefit from skilled PT to increase their independence and safety with mobility to allow discharge to the venue listed below.  Pt assisted with ambulating using RW today and very agreeable to HHPT and RW upon d/c.     Follow Up Recommendations Home health PT    Equipment Recommendations  Rolling walker with 5" wheels    Recommendations for Other Services       Precautions / Restrictions Precautions Precautions: Fall Restrictions Weight Bearing Restrictions: No      Mobility  Bed Mobility Overal bed mobility: Needs Assistance Bed Mobility: Supine to Sit;Sit to Supine     Supine to sit: Supervision Sit to supine: Supervision      Transfers Overall transfer level: Needs assistance Equipment used: Rolling walker (2 wheeled) Transfers: Sit to/from Stand Sit to Stand: Min guard;From elevated surface         General transfer comment: verbal cues for hand placement  Ambulation/Gait Ambulation/Gait assistance: Min guard Ambulation Distance (Feet): 60 Feet Assistive device: Rolling walker (2 wheeled) Gait Pattern/deviations: Step-through pattern;Decreased stride length;Wide base of support     General Gait Details: verbal cues for safe use of RW, encouraged using RW initially upon d/c home as pt reports chronic knee pain (usually uses SPC)  Stairs            Wheelchair Mobility    Modified Rankin (Stroke Patients Only)       Balance                                              Pertinent Vitals/Pain Pain Assessment: No/denies pain    Home Living Family/patient expects to be discharged to:: Private residence Living Arrangements: Alone Available Help at Discharge: Family;Available PRN/intermittently Type of Home: House Home Access: Stairs to enter     Home Layout: Laundry or work area in basement;Two level;Able to live on main level with bedroom/bathroom Home Equipment: Gilmer Mor - single point;Toilet riser      Prior Function Level of Independence: Independent with assistive device(s)         Comments: uses SPC, chronic knee pain per pt     Hand Dominance        Extremity/Trunk Assessment               Lower Extremity Assessment: Generalized weakness         Communication   Communication: No difficulties  Cognition Arousal/Alertness: Awake/alert Behavior During Therapy: WFL for tasks assessed/performed Overall Cognitive Status: Within Functional Limits for tasks assessed                      General Comments      Exercises        Assessment/Plan    PT Assessment Patient needs continued PT services  PT Diagnosis Difficulty walking   PT Problem List Decreased strength;Decreased activity tolerance;Decreased mobility;Obesity;Decreased knowledge of use of DME  PT Treatment Interventions DME instruction;Gait training;Functional mobility  training;Patient/family education;Therapeutic activities;Therapeutic exercise;Stair training   PT Goals (Current goals can be found in the Care Plan section) Acute Rehab PT Goals PT Goal Formulation: With patient Time For Goal Achievement: 11/24/14 Potential to Achieve Goals: Good    Frequency Min 3X/week   Barriers to discharge        Co-evaluation               End of Session   Activity Tolerance: Patient tolerated treatment well Patient left: in bed;with call bell/phone within reach;with family/visitor present Nurse Communication: Mobility status          Time: 6962-9528 PT Time Calculation (min) (ACUTE ONLY): 24 min   Charges:   PT Evaluation $Initial PT Evaluation Tier I: 1 Procedure PT Treatments $Gait Training: 8-22 mins   PT G Codes:        Jamari Moten,KATHrine E 11/17/2014, 12:24 PM Zenovia Jarred, PT, DPT 11/17/2014 Pager: [phone]

## 2014-11-17 NOTE — Care Management Important Message (Signed)
Important Message  Patient Details  Name: Martha Wise MRN: [id number] Date of Birth: 1940-10-18   Medicare Important Message Given:  Yes-second notification given    [name] 11/17/2014, 12:39 PMImportant Message  Patient Details  Name: Martha Wise MRN: [id number] Date of Birth: Jul 23, 1940   Medicare Important Message Given:  Yes-second notification given    [name] 11/17/2014, 12:38 PM

## 2014-11-17 NOTE — Care Management Note (Signed)
Case Management Note  Patient Details  Name: Martha Wise MRN: [id number] Date of Birth: 1940/08/23  Subjective/Objective: PT-HHPT, rw. [facility] chosen for   [facility]. TC Kristen rep aware of referral, HHC orders, & d/c.  Will deliver rw to patient's rm.             Action/Plan:d/c plan home w/HHC, RW.No further d/c needs.   Expected Discharge Date:   (unknown)               Expected Discharge Plan:  Home w Home Health Services  In-House Referral:  NA  Discharge planning Services  CM Consult  Post Acute Care Choice:  NA Choice offered to:  Patient  DME Arranged:  Walker rolling DME Agency:  NA, [facility]  HH Arranged:  PT HH Agency:  [facility]  Status of Service:  Completed, signed off  Medicare Important Message Given:    Date Medicare IM Given:    Medicare IM give by:    Date Additional Medicare IM Given:    Additional Medicare Important Message give by:     If discussed at Long Length of Stay Meetings, dates discussed:    Additional Comments:  [name], RN 11/17/2014, 12:19 PM

## 2014-11-17 NOTE — Care Management Note (Signed)
Case Management Note  Patient Details  Name: Martha Wise MRN: [id number] Date of Birth: 1940/09/22  Subjective/Objective:                    Action/Plan:d/c home w/[facility]/HHPT, home rw.   Expected Discharge Date:   (unknown)               Expected Discharge Plan:  Home w Home Health Services  In-House Referral:  NA  Discharge planning Services  CM Consult  Post Acute Care Choice:  NA Choice offered to:  Patient  DME Arranged:  [facility] rolling DME Agency:  NA, [facility]  HH Arranged:  PT, RN [facility] Agency:  [facility]  Status of Service:  Completed, signed off  Medicare Important Message Given:  Yes-second notification given Date Medicare IM Given:    Medicare IM give by:    Date Additional Medicare IM Given:    Additional Medicare Important Message give by:     If discussed at Long Length of Stay Meetings, dates discussed:    Additional Comments:  [name], RN 11/17/2014, 1:35 PM

## 2014-11-17 NOTE — Progress Notes (Signed)
[facility] PROGRESS NOTE Subjective No further bleeding EGD completely negative  Objective: Vital signs in last 24 hours: Temp:  [98.1 F (36.7 C)-99 F (37.2 C)] 98.3 F (36.8 C) (07/27 0500) Pulse Rate:  [78-94] 78 (07/27 0500) Resp:  [18-24] 18 (07/27 0500) BP: (120-167)/(30-67) 132/44 mmHg (07/27 0500) SpO2:  [96 %-100 %] 99 % (07/27 0500) Last BM Date: 11/16/14  Intake/Output from previous day: 07/26 0701 - 07/27 0700 In: 3245 [P.O.:360; I.V.:2550; Blood:335] Out: 1400 [Urine:1400] Intake/Output this shift: Total I/O In: 240 [P.O.:240] Out: 400 [Urine:400]    Lab Results:  Recent Labs  11/15/14 1745 11/16/14 0450 11/16/14 1235 11/16/14 1722 11/17/14 0529 11/17/14 1104  WBC 9.5 8.3  --  6.9 6.9 5.6  HGB 8.3* 7.7* 7.1* 7.9* 7.8* 8.0*  HCT 25.0* 23.2* 21.0* 23.5* 24.8* 24.3*  PLT 117* 95*  --  84* 92* 82*   BMET  Recent Labs  11/15/14 1206 11/16/14 0450 11/16/14 1235 11/17/14 1104  NA 134* 137 142 138  K 4.5 4.0 4.0 3.4*  CL 102 108  --  107  CO2 21* 26  --  25  CREATININE 0.85 0.73  --  0.73   LFT  Recent Labs  11/15/14 1206 11/16/14 0450  PROT 5.4* 5.1*  AST 58* 56*  ALT 30 27  ALKPHOS 63 51  BILITOT 2.2* 1.7*   PT/INR  Recent Labs  11/15/14 1206  LABPROT 17.7*  INR 1.45   PANCREAS  Recent Labs  11/15/14 1206  LIPASE 28         Studies/Results: No results found.  Medications: I have reviewed the patient's current medications.  Assessment/Plan: 1. Hematemesis. EGD negative, Hg stable should be ok to discharge and f/u in office with Dr Matthias Hughs in 2-3 weeks. Will sign off please for problems.   Amry Cathy JR,Taji Sather L 11/17/2014, 1:18 PM  Pager: [phone] If no answer or after hours call [phone]

## 2014-11-19 LAB — TYPE AND SCREEN
ABO/RH(D): O POS
ANTIBODY SCREEN: NEGATIVE
UNIT DIVISION: 0
Unit division: 0

## 2014-11-21 ENCOUNTER — Encounter (HOSPITAL_COMMUNITY): Payer: Self-pay | Admitting: Emergency Medicine

## 2014-11-21 ENCOUNTER — Emergency Department (HOSPITAL_COMMUNITY): Payer: Medicare Other

## 2014-11-21 ENCOUNTER — Inpatient Hospital Stay (HOSPITAL_COMMUNITY)
Admission: EM | Admit: 2014-11-21 | Discharge: 2014-11-25 | DRG: 378 | Disposition: A | Discharge status: Skilled Nursing Facility | Payer: Medicare Other | Attending: Internal Medicine | Admitting: Internal Medicine

## 2014-11-21 DIAGNOSIS — F1020 Alcohol dependence, uncomplicated: Secondary | ICD-10-CM | POA: Diagnosis present

## 2014-11-21 DIAGNOSIS — R040 Epistaxis: Secondary | ICD-10-CM | POA: Diagnosis not present

## 2014-11-21 DIAGNOSIS — D6959 Other secondary thrombocytopenia: Secondary | ICD-10-CM | POA: Diagnosis present

## 2014-11-21 DIAGNOSIS — K922 Gastrointestinal hemorrhage, unspecified: Secondary | ICD-10-CM | POA: Diagnosis present

## 2014-11-21 DIAGNOSIS — F1010 Alcohol abuse, uncomplicated: Secondary | ICD-10-CM | POA: Diagnosis present

## 2014-11-21 DIAGNOSIS — Z888 Allergy status to other drugs, medicaments and biological substances status: Secondary | ICD-10-CM | POA: Diagnosis not present

## 2014-11-21 DIAGNOSIS — K766 Portal hypertension: Secondary | ICD-10-CM | POA: Diagnosis present

## 2014-11-21 DIAGNOSIS — G9340 Encephalopathy, unspecified: Secondary | ICD-10-CM

## 2014-11-21 DIAGNOSIS — E039 Hypothyroidism, unspecified: Secondary | ICD-10-CM | POA: Diagnosis present

## 2014-11-21 DIAGNOSIS — Z825 Family history of asthma and other chronic lower respiratory diseases: Secondary | ICD-10-CM | POA: Diagnosis not present

## 2014-11-21 DIAGNOSIS — I8500 Esophageal varices without bleeding: Secondary | ICD-10-CM | POA: Diagnosis present

## 2014-11-21 DIAGNOSIS — D62 Acute posthemorrhagic anemia: Secondary | ICD-10-CM | POA: Diagnosis not present

## 2014-11-21 DIAGNOSIS — J45909 Unspecified asthma, uncomplicated: Secondary | ICD-10-CM | POA: Diagnosis present

## 2014-11-21 DIAGNOSIS — G4733 Obstructive sleep apnea (adult) (pediatric): Secondary | ICD-10-CM | POA: Diagnosis present

## 2014-11-21 DIAGNOSIS — Z8249 Family history of ischemic heart disease and other diseases of the circulatory system: Secondary | ICD-10-CM | POA: Diagnosis not present

## 2014-11-21 DIAGNOSIS — E119 Type 2 diabetes mellitus without complications: Secondary | ICD-10-CM

## 2014-11-21 DIAGNOSIS — K760 Fatty (change of) liver, not elsewhere classified: Secondary | ICD-10-CM | POA: Diagnosis present

## 2014-11-21 DIAGNOSIS — K7030 Alcoholic cirrhosis of liver without ascites: Secondary | ICD-10-CM | POA: Diagnosis not present

## 2014-11-21 DIAGNOSIS — R234 Changes in skin texture: Secondary | ICD-10-CM | POA: Diagnosis present

## 2014-11-21 DIAGNOSIS — K7040 Alcoholic hepatic failure without coma: Secondary | ICD-10-CM | POA: Diagnosis present

## 2014-11-21 DIAGNOSIS — F10230 Alcohol dependence with withdrawal, uncomplicated: Secondary | ICD-10-CM

## 2014-11-21 DIAGNOSIS — M797 Fibromyalgia: Secondary | ICD-10-CM | POA: Diagnosis present

## 2014-11-21 DIAGNOSIS — R161 Splenomegaly, not elsewhere classified: Secondary | ICD-10-CM | POA: Diagnosis present

## 2014-11-21 DIAGNOSIS — J4540 Moderate persistent asthma, uncomplicated: Secondary | ICD-10-CM | POA: Diagnosis not present

## 2014-11-21 DIAGNOSIS — I864 Gastric varices: Secondary | ICD-10-CM | POA: Diagnosis present

## 2014-11-21 DIAGNOSIS — D696 Thrombocytopenia, unspecified: Secondary | ICD-10-CM | POA: Diagnosis not present

## 2014-11-21 DIAGNOSIS — K7290 Hepatic failure, unspecified without coma: Secondary | ICD-10-CM | POA: Diagnosis not present

## 2014-11-21 DIAGNOSIS — Z794 Long term (current) use of insulin: Secondary | ICD-10-CM | POA: Diagnosis not present

## 2014-11-21 DIAGNOSIS — I10 Essential (primary) hypertension: Secondary | ICD-10-CM | POA: Diagnosis present

## 2014-11-21 DIAGNOSIS — Z79899 Other long term (current) drug therapy: Secondary | ICD-10-CM | POA: Diagnosis not present

## 2014-11-21 DIAGNOSIS — G3184 Mild cognitive impairment, so stated: Secondary | ICD-10-CM | POA: Diagnosis present

## 2014-11-21 DIAGNOSIS — Z809 Family history of malignant neoplasm, unspecified: Secondary | ICD-10-CM | POA: Diagnosis not present

## 2014-11-21 DIAGNOSIS — E118 Type 2 diabetes mellitus with unspecified complications: Secondary | ICD-10-CM

## 2014-11-21 DIAGNOSIS — Z885 Allergy status to narcotic agent status: Secondary | ICD-10-CM

## 2014-11-21 DIAGNOSIS — K920 Hematemesis: Secondary | ICD-10-CM | POA: Diagnosis present

## 2014-11-21 LAB — I-STAT CHEM 8, ED
BUN: 13 mg/dL (ref 6–20)
Calcium, Ion: 1.14 mmol/L (ref 1.13–1.30)
Chloride: 108 mmol/L (ref 101–111)
Creatinine, Ser: 0.7 mg/dL (ref 0.44–1.00)
GLUCOSE: 145 mg/dL — AB (ref 65–99)
HEMATOCRIT: 21 % — AB (ref 36.0–46.0)
Hemoglobin: 7.1 g/dL — ABNORMAL LOW (ref 12.0–15.0)
Potassium: 3.9 mmol/L (ref 3.5–5.1)
SODIUM: 140 mmol/L (ref 135–145)
TCO2: 19 mmol/L (ref 0–100)

## 2014-11-21 LAB — CBC WITH DIFFERENTIAL/PLATELET
BASOS ABS: 0.1 10*3/uL (ref 0.0–0.1)
Basophils Relative: 1 % (ref 0–1)
Eosinophils Absolute: 0.1 10*3/uL (ref 0.0–0.7)
Eosinophils Relative: 1 % (ref 0–5)
HEMATOCRIT: 23.1 % — AB (ref 36.0–46.0)
Hemoglobin: 7.7 g/dL — ABNORMAL LOW (ref 12.0–15.0)
LYMPHS PCT: 14 % (ref 12–46)
Lymphs Abs: 1.4 10*3/uL (ref 0.7–4.0)
MCH: 32 pg (ref 26.0–34.0)
MCHC: 33.3 g/dL (ref 30.0–36.0)
MCV: 95.9 fL (ref 78.0–100.0)
Monocytes Absolute: 0.6 10*3/uL (ref 0.1–1.0)
Monocytes Relative: 6 % (ref 3–12)
NEUTROS ABS: 7.8 10*3/uL — AB (ref 1.7–7.7)
Neutrophils Relative %: 78 % — ABNORMAL HIGH (ref 43–77)
PLATELETS: 136 10*3/uL — AB (ref 150–400)
RBC: 2.41 MIL/uL — ABNORMAL LOW (ref 3.87–5.11)
RDW: 15.4 % (ref 11.5–15.5)
WBC: 9.9 10*3/uL (ref 4.0–10.5)

## 2014-11-21 LAB — COMPREHENSIVE METABOLIC PANEL
ALT: 43 U/L (ref 14–54)
ANION GAP: 10 (ref 5–15)
AST: 78 U/L — ABNORMAL HIGH (ref 15–41)
Albumin: 2.6 g/dL — ABNORMAL LOW (ref 3.5–5.0)
Alkaline Phosphatase: 68 U/L (ref 38–126)
BILIRUBIN TOTAL: 1.4 mg/dL — AB (ref 0.3–1.2)
BUN: 15 mg/dL (ref 6–20)
CHLORIDE: 108 mmol/L (ref 101–111)
CO2: 22 mmol/L (ref 22–32)
CREATININE: 0.7 mg/dL (ref 0.44–1.00)
Calcium: 8.8 mg/dL — ABNORMAL LOW (ref 8.9–10.3)
GFR calc non Af Amer: >60 mL/min (ref >60)
Glucose, Bld: 150 mg/dL — ABNORMAL HIGH (ref 65–99)
POTASSIUM: 4 mmol/L (ref 3.5–5.1)
Sodium: 140 mmol/L (ref 135–145)
TOTAL PROTEIN: 5.2 g/dL — AB (ref 6.5–8.1)

## 2014-11-21 LAB — PREPARE RBC (CROSSMATCH)

## 2014-11-21 LAB — HEMOGLOBIN AND HEMATOCRIT, BLOOD
HCT: 21.3 % — ABNORMAL LOW (ref 36.0–46.0)
Hemoglobin: 7 g/dL — ABNORMAL LOW (ref 12.0–15.0)

## 2014-11-21 LAB — ETHANOL: Alcohol, Ethyl (B): <5 mg/dL (ref <5)

## 2014-11-21 LAB — PROTIME-INR
INR: 1.33 (ref 0.00–1.49)
PROTHROMBIN TIME: 16.6 s — AB (ref 11.6–15.2)

## 2014-11-21 LAB — LIPASE, BLOOD: LIPASE: 62 U/L — AB (ref 22–51)

## 2014-11-21 MED ORDER — FLUOXETINE HCL 20 MG PO CAPS
20.0000 mg | ORAL_CAPSULE | Freq: Every evening | ORAL | Status: DC
  Administered 2014-11-22 – 2014-11-24 (×3): 20 mg via ORAL
  Filled 2014-11-21 (×4): qty 1

## 2014-11-21 MED ORDER — LEVOTHYROXINE SODIUM 137 MCG PO TABS
137.0000 ug | ORAL_TABLET | Freq: Every day | ORAL | Status: DC
  Administered 2014-11-24 – 2014-11-25 (×2): 137 ug via ORAL
  Filled 2014-11-21 (×5): qty 1

## 2014-11-21 MED ORDER — ACETAMINOPHEN 650 MG RE SUPP: 650.0000 mg | Freq: Four times a day (QID) | RECTAL | Status: DC | PRN

## 2014-11-21 MED ORDER — SODIUM CHLORIDE 0.9 % IV SOLN
8.0000 mg/h | INTRAVENOUS | Status: AC
  Administered 2014-11-21 – 2014-11-24 (×3): 8 mg/h via INTRAVENOUS
  Filled 2014-11-21 (×10): qty 80

## 2014-11-21 MED ORDER — PANTOPRAZOLE SODIUM 40 MG IV SOLR
40.0000 mg | Freq: Two times a day (BID) | INTRAVENOUS | Status: DC
  Administered 2014-11-25: 40 mg via INTRAVENOUS
  Filled 2014-11-21 (×2): qty 40

## 2014-11-21 MED ORDER — ONDANSETRON HCL 4 MG PO TABS: 4.0000 mg | ORAL_TABLET | Freq: Four times a day (QID) | ORAL | Status: DC | PRN

## 2014-11-21 MED ORDER — OXYCODONE HCL 5 MG PO TABS: 5.0000 mg | ORAL_TABLET | ORAL | Status: DC | PRN

## 2014-11-21 MED ORDER — ACETAMINOPHEN 325 MG PO TABS: 650.0000 mg | ORAL_TABLET | Freq: Four times a day (QID) | ORAL | Status: DC | PRN

## 2014-11-21 MED ORDER — INSULIN ASPART 100 UNIT/ML ~~LOC~~ SOLN
0.0000 [IU] | SUBCUTANEOUS | Status: DC
  Administered 2014-11-22 (×3): 1 [IU] via SUBCUTANEOUS
  Administered 2014-11-23 (×2): 2 [IU] via SUBCUTANEOUS
  Administered 2014-11-24: 3 [IU] via SUBCUTANEOUS
  Administered 2014-11-24 (×2): 2 [IU] via SUBCUTANEOUS
  Administered 2014-11-24: 1 [IU] via SUBCUTANEOUS
  Administered 2014-11-24: 2 [IU] via SUBCUTANEOUS
  Administered 2014-11-24 – 2014-11-25 (×2): 3 [IU] via SUBCUTANEOUS
  Administered 2014-11-25: 2 [IU] via SUBCUTANEOUS

## 2014-11-21 MED ORDER — IPRATROPIUM-ALBUTEROL 0.5-2.5 (3) MG/3ML IN SOLN: 3.0000 mL | RESPIRATORY_TRACT | Status: DC | PRN

## 2014-11-21 MED ORDER — SODIUM CHLORIDE 0.9 % IV SOLN
80.0000 mg | Freq: Once | INTRAVENOUS | Status: AC
  Administered 2014-11-21: 80 mg via INTRAVENOUS
  Filled 2014-11-21: qty 80

## 2014-11-21 MED ORDER — ONDANSETRON HCL 4 MG/2ML IJ SOLN
4.0000 mg | Freq: Four times a day (QID) | INTRAMUSCULAR | Status: DC | PRN
  Administered 2014-11-22 (×3): 4 mg via INTRAVENOUS
  Filled 2014-11-21 (×3): qty 2

## 2014-11-21 MED ORDER — THIAMINE HCL 100 MG/ML IJ SOLN
Freq: Once | INTRAVENOUS | Status: AC
  Administered 2014-11-21: 23:00:00 via INTRAVENOUS
  Filled 2014-11-21: qty 1000

## 2014-11-21 MED ORDER — PANTOPRAZOLE SODIUM 40 MG IV SOLR
40.0000 mg | Freq: Once | INTRAVENOUS | Status: AC
  Administered 2014-11-21: 40 mg via INTRAVENOUS
  Filled 2014-11-21: qty 40

## 2014-11-21 MED ORDER — ALUM & MAG HYDROXIDE-SIMETH 200-200-20 MG/5ML PO SUSP: 30.0000 mL | Freq: Four times a day (QID) | ORAL | Status: DC | PRN

## 2014-11-21 MED ORDER — SODIUM CHLORIDE 0.9 % IV SOLN
Freq: Once | INTRAVENOUS | Status: AC
  Administered 2014-11-21: 23:00:00 via INTRAVENOUS

## 2014-11-21 MED ORDER — OCTREOTIDE LOAD VIA INFUSION
50.0000 ug | Freq: Once | INTRAVENOUS | Status: AC
  Administered 2014-11-21: 50 ug via INTRAVENOUS
  Filled 2014-11-21: qty 25

## 2014-11-21 MED ORDER — IOHEXOL 300 MG/ML  SOLN
100.0000 mL | Freq: Once | INTRAMUSCULAR | Status: AC | PRN
  Administered 2014-11-21: 100 mL via INTRAVENOUS

## 2014-11-21 MED ORDER — SODIUM CHLORIDE 0.9 % IV SOLN
50.0000 ug/h | INTRAVENOUS | Status: DC
  Administered 2014-11-21 – 2014-11-22 (×2): 50 ug/h via INTRAVENOUS
  Filled 2014-11-21 (×4): qty 1

## 2014-11-21 MED ORDER — LORAZEPAM 2 MG/ML IJ SOLN: 2.0000 mg | INTRAMUSCULAR | Status: DC | PRN

## 2014-11-21 MED ORDER — HYDROMORPHONE HCL 1 MG/ML IJ SOLN: 0.5000 mg | INTRAMUSCULAR | Status: DC | PRN

## 2014-11-21 MED ORDER — INSULIN DETEMIR 100 UNIT/ML ~~LOC~~ SOLN
40.0000 [IU] | Freq: Two times a day (BID) | SUBCUTANEOUS | Status: DC
  Administered 2014-11-21 – 2014-11-25 (×6): 40 [IU] via SUBCUTANEOUS
  Filled 2014-11-21 (×9): qty 0.4

## 2014-11-21 MED ORDER — SODIUM CHLORIDE 0.9 % IV BOLUS (SEPSIS)
1000.0000 mL | Freq: Once | INTRAVENOUS | Status: AC
Start: 2014-11-21 — End: 2014-11-21
  Administered 2014-11-21: 1000 mL via INTRAVENOUS

## 2014-11-21 MED ORDER — SODIUM CHLORIDE 0.9 % IV SOLN
INTRAVENOUS | Status: DC
  Administered 2014-11-21 – 2014-11-22 (×2): via INTRAVENOUS

## 2014-11-21 NOTE — ED Provider Notes (Signed)
CSN: [id number]     Arrival date & time 11/21/14  1637 History   First MD Initiated Contact with Patient 11/21/14 1645     Chief Complaint  Patient presents with  . Hematemesis     (Consider location/radiation/quality/duration/timing/severity/associated sxs/prior Treatment) Patient is a 74 y.o. female presenting with vomiting. The history is provided by the patient. The history is limited by the condition of the patient.  Emesis Severity:  Moderate Duration:  1 day Timing:  Constant Quality:  Bright red blood Able to tolerate:  Liquids and solids Progression:  Resolved Chronicity:  Recurrent Recent urination:  Normal Relieved by:  Nothing Worsened by:  Nothing tried Ineffective treatments:  None tried Associated symptoms: abdominal pain   Associated symptoms: no arthralgias, no chills, no headaches and no myalgias   Risk factors: alcohol use and prior abdominal surgery    74 yo F with a chief complaint of hematemesis. Patient has had this multiple times with a history of cirrhosis secondary to alcohol use. Was seen as recently as last week for a similar complaint. This time patient said she only had one episode of emesis vomited 3 times right red blood. Has had dark stools over the past week as well. Patient denies lightheadedness or dizziness fever. Patient has had some periumbilical abdominal pain over the past week or so slightly worsening with vomiting.  Patient was seen and admitted last week had an EGD without abnormality. Patient had no recurrent vomiting and was discharged home.  Past Medical History  Diagnosis Date  . OSA (obstructive sleep apnea)   . Asthma   . Hypertension   . Diabetes   . Allergic rhinitis   . Fibromyalgia   . Alcoholic cirrhosis   . Esophageal varices   . Hypothyroid    Past Surgical History  Procedure Laterality Date  . Abdominal hysterectomy    . Bladder tact    . Esophagogastroduodenoscopy N/A 11/16/2014    Procedure:  ESOPHAGOGASTRODUODENOSCOPY (EGD);  Surgeon: Carman Ching, MD;  Location: [facility];  Service: Endoscopy;  Laterality: N/A;  . Gastric varices banding N/A 11/16/2014    Procedure: GASTRIC VARICES BANDING;  Surgeon: Carman Ching, MD;  Location: [facility];  Service: Endoscopy;  Laterality: N/A;   Family History  Problem Relation Age of Onset  . Allergies Mother   . Asthma Mother     children   . Heart disease      father side of family  . Cancer Mother    History  Substance Use Topics  . Smoking status: Never Smoker   . Smokeless tobacco: Never Used  . Alcohol Use: 0.0 oz/week     Comment: drinks wine    OB History    No data available     Review of Systems  Constitutional: Negative for fever and chills.  HENT: Negative for congestion and rhinorrhea.   Eyes: Negative for redness and visual disturbance.  Respiratory: Negative for shortness of breath and wheezing.   Cardiovascular: Negative for chest pain and palpitations.  Gastrointestinal: Positive for nausea, vomiting, abdominal pain, constipation, blood in stool and anal bleeding.  Genitourinary: Negative for dysuria and urgency.  Musculoskeletal: Negative for myalgias and arthralgias.  Skin: Negative for pallor and wound.  Neurological: Negative for dizziness and headaches.      Allergies  Phenergan; Hydrocodone; and Glimepiride  Home Medications   Prior to Admission medications   Medication Sig Start Date End Date Taking? Authorizing Provider  B-D ULTRAFINE III SHORT PEN 31G X 8  MM MISC 1 each by Subconjunctival route as directed.  06/22/14  Yes Historical Provider, MD  Carboxymethylcellul-Glycerin (REFRESH OPTIVE) 1-0.9 % GEL Apply 1-2 drops to eye daily as needed (dry eyes).    Yes Historical Provider, MD  cholecalciferol (VITAMIN D) 1000 UNITS tablet Take 3,000 Units by mouth every other day.    Yes Historical Provider, MD  docusate sodium (COLACE) 100 MG capsule Take 100 mg by mouth every evening.   Yes  Historical Provider, MD  ferrous sulfate (FERROUSUL) 325 (65 FE) MG tablet Take 1 tablet (325 mg total) by mouth daily with breakfast. 11/17/14  Yes Calvert Cantor, MD  FLUoxetine (PROZAC) 20 MG capsule Take 20 mg by mouth every evening.    Yes Historical Provider, MD  Fluticasone-Salmeterol (ADVAIR DISKUS) 250-50 MCG/DOSE AEPB take 1 puff by mouth twice a day during the winter 04/17/13  Yes Melissa Smith, PA-C  HUMALOG KWIKPEN 100 UNIT/ML KiwkPen Inject 20 Units into the skin 3 (three) times daily.  11/02/14  Yes Historical Provider, MD  LEVEMIR FLEXTOUCH 100 UNIT/ML Pen Inject 80 Units into the skin 2 (two) times daily.  11/03/14  Yes Historical Provider, MD  metFORMIN (GLUCOPHAGE-XR) 500 MG 24 hr tablet Take 1,000 mg by mouth daily with breakfast. 2 tablets in the am 01/17/12  Yes Historical Provider, MD  Multiple Vitamins-Minerals (CENTRUM SILVER ADULT 50+ PO) Take 1 tablet by mouth daily.   Yes Historical Provider, MD  ONE TOUCH ULTRA TEST test strip 1 each by Other route as directed.  06/16/14  Yes Historical Provider, MD  SYNTHROID 137 MCG tablet Take 137 mcg by mouth daily before breakfast.  06/15/14  Yes Historical Provider, MD  valsartan (DIOVAN) 320 MG tablet Take 320 mg by mouth every morning.    Yes Historical Provider, MD   BP 114/38 mmHg  Pulse 98  Temp(Src) 98.8 F (37.1 C) (Oral)  Resp 18  SpO2 100% Physical Exam  Constitutional: She is oriented to person, place, and time. She appears well-developed and well-nourished. No distress.  Pallor  HENT:  Head: Normocephalic and atraumatic.  Eyes: EOM are normal. Pupils are equal, round, and reactive to light.  Neck: Normal range of motion. Neck supple.  Cardiovascular: Normal rate and regular rhythm.  Exam reveals no gallop and no friction rub.   No murmur heard. Pulmonary/Chest: Effort normal. She has no wheezes. She has no rales.  Abdominal: Soft. She exhibits no distension. There is tenderness (worse in the epigastrium.). There is no  rebound.  Musculoskeletal: She exhibits no edema or tenderness.  Neurological: She is alert and oriented to person, place, and time.  Skin: Skin is warm and dry. She is not diaphoretic.  Psychiatric: She has a normal mood and affect. Her behavior is normal.    ED Course  Procedures (including critical care time) Labs Review Labs Reviewed  CBC WITH DIFFERENTIAL/PLATELET - Abnormal; Notable for the following:    RBC 2.41 (*)    Hemoglobin 7.7 (*)    HCT 23.1 (*)    Platelets 136 (*)    Neutrophils Relative % 78 (*)    Neutro Abs 7.8 (*)    All other components within normal limits  COMPREHENSIVE METABOLIC PANEL - Abnormal; Notable for the following:    Glucose, Bld 150 (*)    Calcium 8.8 (*)    Total Protein 5.2 (*)    Albumin 2.6 (*)    AST 78 (*)    Total Bilirubin 1.4 (*)    All other components  within normal limits  PROTIME-INR - Abnormal; Notable for the following:    Prothrombin Time 16.6 (*)    All other components within normal limits  LIPASE, BLOOD - Abnormal; Notable for the following:    Lipase 62 (*)    All other components within normal limits  HEMOGLOBIN AND HEMATOCRIT, BLOOD - Abnormal; Notable for the following:    Hemoglobin 7.0 (*)    HCT 21.3 (*)    All other components within normal limits  I-STAT CHEM 8, ED - Abnormal; Notable for the following:    Glucose, Bld 145 (*)    Hemoglobin 7.1 (*)    HCT 21.0 (*)    All other components within normal limits  MRSA PCR SCREENING  ETHANOL  HEMOGLOBIN A1C  BASIC METABOLIC PANEL  CBC  TYPE AND SCREEN  PREPARE RBC (CROSSMATCH)    Imaging Review Ct Abdomen Pelvis W Contrast  11/21/2014   CLINICAL DATA:  Abdominal pain and GI bleeding. Vomiting blood. Initial encounter.  EXAM: CT ABDOMEN AND PELVIS WITH CONTRAST  TECHNIQUE: Multidetector CT imaging of the abdomen and pelvis was performed using the standard protocol following bolus administration of intravenous contrast.  CONTRAST:  OMNIPAQUE IOHEXOL 300  MG/ML  SOLN  COMPARISON:  01/27/2014.  FINDINGS: Musculoskeletal: No aggressive osseous lesions. Lumbar spondylosis is expected for age. No AVN.  Lung Bases: Atelectasis.  Liver: Hepatic cirrhosis is present. Discrete mass lesion. No change from recent MRI.  Spleen:  Chronic splenomegaly.  Gallbladder:  Within normal limits.  Common bile duct:  Normal.  Pancreas:  Fatty infiltration.  Adrenal glands:  Normal bilaterally.  Kidneys: Normal enhancement and delayed excretion of contrast. LEFT ureter normal. RIGHT ureter normal.  Stomach: Distended with food and fluid. Oral contrast is present in the antrum.  Small bowel:  No small bowel obstruction or inflammatory changes.  Colon:   Diverticulosis without diverticulitis.  Pelvic Genitourinary: Hysterectomy. Urinary bladder appears normal.  Peritoneum: No ascites.  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 Lymphadenopathy is also present in the upper abdomen secondary to cirrhosis and portal hypertension. The main portal vein is patent. Abdominal aortic atherosclerosis. Esophageal varices are also present although less pronounced and gastric varices.  Body Wall: Infiltration of the subcutaneous fat of the abdominal wall which may represent panniculitis in the appropriate setting. Ventral hernia repair.  IMPRESSION: 1. Hepatic cirrhosis with gastric varices. Esophageal varices are also present. Both of these may represent sources of hematemesis. 2. Portal venous hypertension and splenomegaly.   Electronically Signed   By: Andreas Newport M.D.   On: 11/21/2014 19:48     EKG Interpretation None      MDM   Final diagnoses:  Upper GI bleed    75 yo F with a chief complaint of hematemesis. Final signs stable on my exam. We'll check CBC obtain CT scan of the abdomen and pelvis.  CT scan with varices. Patient without recurrent episodes here. Hemoglobin stable. Will admit place and stepdown. Spoke with GI will see the patient in  the ED. Given Protonix.   Admitting doctor requesting Protonix drip started while in the ED.  The patients results and plan were reviewed and discussed.   Any x-rays performed were independently reviewed by myself.   Differential diagnosis were considered with the presenting HPI.  Medications  pantoprazole (PROTONIX) 80 mg in sodium chloride 0.9 % 250 mL (0.32 mg/mL) infusion (8 mg/hr Intravenous New Bag/Given 11/21/14 2327)  levothyroxine (SYNTHROID, LEVOTHROID) tablet 137 mcg (  not administered)  FLUoxetine (PROZAC) capsule 20 mg (not administered)  insulin detemir (LEVEMIR) injection 40 Units (not administered)  insulin aspart (novoLOG) injection 0-9 Units (not administered)  0.9 %  sodium chloride infusion ( Intravenous New Bag/Given 11/21/14 2327)  acetaminophen (TYLENOL) tablet 650 mg (not administered)    Or  acetaminophen (TYLENOL) suppository 650 mg (not administered)  oxyCODONE (Oxy IR/ROXICODONE) immediate release tablet 5 mg (not administered)  HYDROmorphone (DILAUDID) injection 0.5-1 mg (not administered)  ondansetron (ZOFRAN) tablet 4 mg (not administered)    Or  ondansetron (ZOFRAN) injection 4 mg (not administered)  alum & mag hydroxide-simeth (MAALOX/MYLANTA) 200-200-20 MG/5ML suspension 30 mL (not administered)  pantoprazole (PROTONIX) 80 mg in sodium chloride 0.9 % 100 mL IVPB (80 mg Intravenous Given 11/21/14 2326)  pantoprazole (PROTONIX) injection 40 mg (not administered)  LORazepam (ATIVAN) injection 2-3 mg (not administered)  ipratropium-albuterol (DUONEB) 0.5-2.5 (3) MG/3ML nebulizer solution 3 mL (not administered)  octreotide (SANDOSTATIN) 2 mcg/mL load via infusion 50 mcg (50 mcg Intravenous Given 11/21/14 2200)    And  octreotide (SANDOSTATIN) 500 mcg in sodium chloride 0.9 % 250 mL (2 mcg/mL) infusion (50 mcg/hr Intravenous New Bag/Given 11/21/14 2328)  sodium chloride 0.9 % bolus 1,000 mL (0 mLs Intravenous Stopped 11/21/14 2215)  pantoprazole (PROTONIX)  injection 40 mg (40 mg Intravenous Given 11/21/14 1738)  iohexol (OMNIPAQUE) 300 MG/ML solution 100 mL (100 mLs Intravenous Contrast Given 11/21/14 1912)  0.9 %  sodium chloride infusion ( Intravenous New Bag/Given 11/21/14 2327)  sodium chloride 0.9 % 1,000 mL with thiamine 100 mg, folic acid 1 mg, multivitamins adult 10 mL infusion ( Intravenous New Bag/Given 11/21/14 2328)    Filed Vitals:   11/21/14 2030 11/21/14 2100 11/21/14 2132 11/21/14 2200  BP: 106/31 119/37 136/40 114/38  Pulse: 92 95 104 98  Temp:    98.8 F (37.1 C)  TempSrc:    Oral  Resp:   22 18  SpO2: 96% 98% 100% 100%    Final diagnoses:  Upper GI bleed    Admission/ observation were discussed with the admitting physician, patient and/or family and they are comfortable with the plan.    Melene Plan, DO 11/21/14 [phone]

## 2014-11-21 NOTE — ED Notes (Signed)
EDP at bedside at this time.  

## 2014-11-21 NOTE — H&P (Signed)
[facility] Admission History and Physical       Martha Wise RUE:[id number] DOB: [birth year] DOA: 11/21/2014  Referring physician:  PCP: Hoyle Sauer, MD  Specialists:   Chief Complaint: Vomited Blood  HPI: Martha Wise is a 74 y.o. female with a history of Alcoholic Cirrhosis, Alcohol Abuse, HTN DM2, Asthma and Hypothyroid who presents tot he ED with complaints of hematemesis x 1 at around 3;30 pm of bright red blood in the amount of approximately 2 tablespoons.  She reports also having peri-umbiical ABD pain and melena  Since 2 pm.    She had an Upper Endoscopy on 07/26 by Dr Randa Evens of GI with negative finding of Bleeding at that time.   She was transfused 1 unit of blood and left the hospital with a hemoglobin of 8.0.    On admission her initial hemoglobin is 7.7.  A CT scan of the ABD was performed and revealed Hepatic Cirrhosis, with Gastric Varices, and evidence of Portal HTN and Splenomegaly.   She was placed on an IV Protonix drip and referred for admission.        Review of Systems: Constitutional: No Weight Loss, No Weight Gain, Night Sweats, Fevers, Chills, Dizziness, Light Headedness, Fatigue, or Generalized Weakness HEENT: No Headaches, Difficulty Swallowing,Tooth/Dental Problems,Sore Throat,  No Sneezing, Rhinitis, Ear Ache, Nasal Congestion, or Post Nasal Drip,  Cardio-vascular:  No Chest pain, Orthopnea, PND, Edema in Lower Extremities, Anasarca, Dizziness, Palpitations  Resp: No Dyspnea, No DOE, No Productive Cough, No Non-Productive Cough, No Hemoptysis, No Wheezing.    GI: No Heartburn, Indigestion, +Abdominal Pain, Nausea, Vomiting, Diarrhea, Constipation, +Hematemesis, Hematochezia, +Melena, Change in Bowel Habits,  Loss of Appetite  GU: No Dysuria, No Change in Color of Urine, No Urgency or Urinary Frequency, No Flank pain.  Musculoskeletal: No Joint Pain or Swelling, No Decreased Range of Motion, No Back Pain.  Neurologic: No Syncope, No  Seizures, Muscle Weakness, Paresthesia, Vision Disturbance or Loss, No Diplopia, No Vertigo, No Difficulty Walking,  Skin: No Rash or Lesions. Psych: No Change in Mood or Affect, No Depression or Anxiety, No Memory loss, No Confusion, or Hallucinations   Past Medical History  Diagnosis Date  . OSA (obstructive sleep apnea)   . Asthma   . Hypertension   . Diabetes   . Allergic rhinitis   . Fibromyalgia   . Alcoholic cirrhosis   . Esophageal varices   . Hypothyroid      Past Surgical History  Procedure Laterality Date  . Abdominal hysterectomy    . Bladder tact    . Esophagogastroduodenoscopy N/A 11/16/2014    Procedure: ESOPHAGOGASTRODUODENOSCOPY (EGD);  Surgeon: Carman Ching, MD;  Location: [facility];  Service: Endoscopy;  Laterality: N/A;  . Gastric varices banding N/A 11/16/2014    Procedure: GASTRIC VARICES BANDING;  Surgeon: Carman Ching, MD;  Location: [facility];  Service: Endoscopy;  Laterality: N/A;      Prior to Admission medications   Medication Sig Start Date End Date Taking? Authorizing Provider  B-D ULTRAFINE III SHORT PEN 31G X 8 MM MISC 1 each by Subconjunctival route as directed.  06/22/14  Yes Historical Provider, MD  Carboxymethylcellul-Glycerin (REFRESH OPTIVE) 1-0.9 % GEL Apply 1-2 drops to eye daily as needed (dry eyes).    Yes Historical Provider, MD  cholecalciferol (VITAMIN D) 1000 UNITS tablet Take 3,000 Units by mouth every other day.    Yes Historical Provider, MD  docusate sodium (COLACE) 100 MG capsule Take 100 mg by mouth every evening.  Yes Historical Provider, MD  ferrous sulfate (FERROUSUL) 325 (65 FE) MG tablet Take 1 tablet (325 mg total) by mouth daily with breakfast. 11/17/14  Yes Calvert Cantor, MD  FLUoxetine (PROZAC) 20 MG capsule Take 20 mg by mouth every evening.    Yes Historical Provider, MD  Fluticasone-Salmeterol (ADVAIR DISKUS) 250-50 MCG/DOSE AEPB take 1 puff by mouth twice a day during the winter 04/17/13  Yes Melissa Smith, PA-C   HUMALOG KWIKPEN 100 UNIT/ML KiwkPen Inject 20 Units into the skin 3 (three) times daily.  11/02/14  Yes Historical Provider, MD  LEVEMIR FLEXTOUCH 100 UNIT/ML Pen Inject 80 Units into the skin 2 (two) times daily.  11/03/14  Yes Historical Provider, MD  metFORMIN (GLUCOPHAGE-XR) 500 MG 24 hr tablet Take 1,000 mg by mouth daily with breakfast. 2 tablets in the am 01/17/12  Yes Historical Provider, MD  Multiple Vitamins-Minerals (CENTRUM SILVER ADULT 50+ PO) Take 1 tablet by mouth daily.   Yes Historical Provider, MD  ONE TOUCH ULTRA TEST test strip 1 each by Other route as directed.  06/16/14  Yes Historical Provider, MD  SYNTHROID 137 MCG tablet Take 137 mcg by mouth daily before breakfast.  06/15/14  Yes Historical Provider, MD  valsartan (DIOVAN) 320 MG tablet Take 320 mg by mouth every morning.    Yes Historical Provider, MD     Allergies  Allergen Reactions  . Phenergan [Promethazine Hcl] Swelling    Lip swelling  . Hydrocodone Other (See Comments)    Gi upset/ abdominal pain  . Glimepiride Anxiety    Caused extreme anxiety      Social History:  reports that she has never smoked. She has never used smokeless tobacco. She reports that she drinks alcohol. She reports that she does not use illicit drugs.     Family History  Problem Relation Age of Onset  . Allergies Mother   . Asthma Mother     children   . Heart disease      father side of family  . Cancer Mother        Physical Exam:  GEN:  Pleasant Obese Elderly  74 y.o. Caucasian female examined and in no acute distress; cooperative with exam Filed Vitals:   11/21/14 1930 11/21/14 2030 11/21/14 2100 11/21/14 2132  BP: 132/32 106/31 119/37 136/40  Pulse: 96 92 95 104  Temp:      TempSrc:      Resp:    22  SpO2: 92% 96% 98% 100%   Blood pressure 136/40, pulse 104, temperature 99 F (37.2 C), temperature source Oral, resp. rate 22, SpO2 100 %. PSYCH: She is alert and oriented x4; does not appear anxious does not appear  depressed; affect is normal HEENT: Normocephalic and Atraumatic, Mucous membranes pink; PERRLA; EOM intact; Fundi:  Benign;  No scleral icterus, Nares: Patent, Oropharynx: Clear, Fair Dentition,    Neck:  FROM, No Cervical Lymphadenopathy nor Thyromegaly or Carotid Bruit; No JVD; Breasts:: Not examined CHEST WALL: No tenderness CHEST: Normal respiration, clear to auscultation bilaterally HEART: Regular rate and rhythm; no murmurs rubs or gallops BACK: No kyphosis or scoliosis; No CVA tenderness ABDOMEN: Positive Bowel Sounds, Obese, Soft Non-Tender, No Rebound or Guarding; No Masses, +Organomegaly. Rectal Exam: Not done EXTREMITIES: No Cyanosis, Clubbing, or Edema; No Ulcerations. Genitalia: not examined PULSES: 2+ and symmetric SKIN: Normal hydration no rash or ulceration CNS:  Alert and Oriented x 4, No Focal Deficits Vascular: pulses palpable throughout    Labs on Admission:  Basic  Metabolic Panel:  Recent Labs Lab 11/15/14 1206 11/16/14 0450 11/16/14 1235 11/17/14 1104 11/21/14 1810 11/21/14 1819  NA 134* 137 142 138 140 140  K 4.5 4.0 4.0 3.4* 4.0 3.9  CL 102 108  --  107 108 108  CO2 21* 26  --  25 22  --   GLUCOSE 410* 302* 234* 222* 150* 145*  BUN 38* 40*  --  23* 15 13  CREATININE 0.85 0.73  --  0.73 0.70 0.70  CALCIUM 9.1 8.2*  --  7.9* 8.8*  --   MG  --  1.4*  --   --   --   --    Liver Function Tests:  Recent Labs Lab 11/15/14 1206 11/16/14 0450 11/21/14 1810  AST 58* 56* 78*  ALT 30 27 43  ALKPHOS 63 51 68  BILITOT 2.2* 1.7* 1.4*  PROT 5.4* 5.1* 5.2*  ALBUMIN 2.6* 2.6* 2.6*    Recent Labs Lab 11/15/14 1206 11/21/14 1810  LIPASE 28 62*   No results for input(s): AMMONIA in the last 168 hours. CBC:  Recent Labs Lab 11/16/14 0450  11/16/14 1722 11/17/14 0529 11/17/14 1104 11/21/14 1810 11/21/14 1819  WBC 8.3  --  6.9 6.9 5.6 9.9  --   NEUTROABS  --   --   --   --   --  7.8*  --   HGB 7.7*  < > 7.9* 7.8* 8.0* 7.7* 7.1*  HCT 23.2*  < >  23.5* 24.8* 24.3* 23.1* 21.0*  MCV 95.5  --  95.1 94.7 95.7 95.9  --   PLT 95*  --  84* 92* 82* 136*  --   < > = values in this interval not displayed. Cardiac Enzymes: No results for input(s): CKTOTAL, CKMB, CKMBINDEX, TROPONINI in the last 168 hours.  BNP (last 3 results) No results for input(s): BNP in the last 8760 hours.  ProBNP (last 3 results) No results for input(s): PROBNP in the last 8760 hours.  CBG:  Recent Labs Lab 11/16/14 2019 11/17/14 0124 11/17/14 0456 11/17/14 0746 11/17/14 1135  GLUCAP 236* 148* 84 145* 200*    Radiological Exams on Admission: Ct Abdomen Pelvis W Contrast  11/21/2014   CLINICAL DATA:  Abdominal pain and GI bleeding. Vomiting blood. Initial encounter.  EXAM: CT ABDOMEN AND PELVIS WITH CONTRAST  TECHNIQUE: Multidetector CT imaging of the abdomen and pelvis was performed using the standard protocol following bolus administration of intravenous contrast.  CONTRAST:  OMNIPAQUE IOHEXOL 300 MG/ML  SOLN  COMPARISON:  01/27/2014.  FINDINGS: Musculoskeletal: No aggressive osseous lesions. Lumbar spondylosis is expected for age. No AVN.  Lung Bases: Atelectasis.  Liver: Hepatic cirrhosis is present. Discrete mass lesion. No change from recent MRI.  Spleen:  Chronic splenomegaly.  Gallbladder:  Within normal limits.  Common bile duct:  Normal.  Pancreas:  Fatty infiltration.  Adrenal glands:  Normal bilaterally.  Kidneys: Normal enhancement and delayed excretion of contrast. LEFT ureter normal. RIGHT ureter normal.  Stomach: Distended with food and fluid. Oral contrast is present in the antrum.  Small bowel:  No small bowel obstruction or inflammatory changes.  Colon:   Diverticulosis without diverticulitis.  Pelvic Genitourinary: Hysterectomy. Urinary bladder appears normal.  Peritoneum: No ascites.  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 Lymphadenopathy is also present in the upper abdomen secondary to cirrhosis and  portal hypertension. The main portal vein is patent. Abdominal aortic atherosclerosis. Esophageal varices are also present although less pronounced and  gastric varices.  Body Wall: Infiltration of the subcutaneous fat of the abdominal wall which may represent panniculitis in the appropriate setting. Ventral hernia repair.  IMPRESSION: 1. Hepatic cirrhosis with gastric varices. Esophageal varices are also present. Both of these may represent sources of hematemesis. 2. Portal venous hypertension and splenomegaly.   Electronically Signed   By: Andreas Newport M.D.   On: 11/21/2014 19:48     EKG: Independently reviewed.    Assessment/Plan:   74 y.o. female with  Principal Problem:   1.    Upper GI bleed  Stepdown Unit Monitoring   IV Protonix Drip  Monitor H/Hs q 8 Hrs   Transfuse PRN   2 units pRBCs on hold  GI consulted to see in AM    Active Problems:    2.   Acute blood loss anemia- initial Hb =7.7   Monitor H/Hs   Transfuse PRN        3.    EtOH dependence   CIWA Protocol with IV Ativan   Continue to decrease ETOH , now down to 2 bottles( 750 ml) of Wine daily     4.   Cirrhosis with alcoholism   + Gastric Varices seen on CT scan      5.   Hypertension   Hold ARB Rx   IV Hydralazine PRN SBP > 160     6.   Diabetes   1/2 Dose of Levemir Insulin ( 40 units SQ BID) while on clear liquids   SSI coverage PRN   Check HbA1c in AM     7.   Hypothyroid   Continue Levothyroxine Rx   Check TSH in AM     8.   Asthma   DuoNebs PRN     9.   Thrombocytopenia- due to Cirrhosis   Monitor Trend     10.   DVT Prophylaxis   SCDs        Code Status:     FULL CODE       Family Communication:   Son at Bedside     Disposition Plan:    Inpatient Status        Time spent:  20 Minutes      Ron Parker [facility] Pager [phone]   If 7AM -7PM Please Contact the Day Rounding Team MD for [facility]  If 7PM-7AM, Please Contact Night-Floor Coverage    [email or web address] Password [facility] 11/21/2014, 9:42 PM     ADDENDUM:   Patient was seen and examined on 11/21/2014

## 2014-11-21 NOTE — ED Notes (Signed)
Hospitalist at bedside at this time 

## 2014-11-21 NOTE — ED Notes (Signed)
Bed: ZO10 Expected date: 11/21/14 Expected time: 4:44 PM Means of arrival: Ambulance Comments:  Vomiting Blood

## 2014-11-21 NOTE — ED Notes (Signed)
Report given to Hans, RN.  

## 2014-11-21 NOTE — ED Notes (Signed)
Attempted IV access x 2 and blood attempt. Both unsuccessful.   Was able to re-adjust IV access that EMS got in route to flush and administer medications.

## 2014-11-21 NOTE — ED Notes (Signed)
Nurse drawing labs. 

## 2014-11-21 NOTE — Consult Note (Signed)
[facility] Consult Note  Referring Provider: No ref. provider found Primary Care Physician:  Tivis Ringer, MD Primary Gastroenterologist:  Dr.  Laurel Dimmer Complaint: HematEmesis HPI: Martha Wise is an 74 y.o. white female  with nonalcoholic fatty liver disease with cirrhosis recently discharged from an admission for upper GI bleeding but with EGD basically showing no source of bleeding specifically no esophageal varices. She states she has had endoscopies before where varices were seen. She went home with a hemoglobin of 8. She states that her stools were still somewhat dark during the week and this morning she had a black stool and vomited a small amount of bright red blood compared to last week.  Past Medical History  Diagnosis Date  . OSA (obstructive sleep apnea)   . Asthma   . Hypertension   . Diabetes   . Allergic rhinitis   . Fibromyalgia   . Alcoholic cirrhosis   . Esophageal varices   . Hypothyroid     Past Surgical History  Procedure Laterality Date  . Abdominal hysterectomy    . Bladder tact    . Esophagogastroduodenoscopy N/A 11/16/2014    Procedure: ESOPHAGOGASTRODUODENOSCOPY (EGD);  Surgeon: Laurence Spates, MD;  Location: [facility];  Service: Endoscopy;  Laterality: N/A;  . Gastric varices banding N/A 11/16/2014    Procedure: GASTRIC VARICES BANDING;  Surgeon: Laurence Spates, MD;  Location: [facility];  Service: Endoscopy;  Laterality: N/A;     (Not in a hospital admission)  Allergies:  Allergies  Allergen Reactions  . Phenergan [Promethazine Hcl] Swelling    Lip swelling  . Hydrocodone Other (See Comments)    Gi upset/ abdominal pain  . Glimepiride Anxiety    Caused extreme anxiety      Family History  Problem Relation Age of Onset  . Allergies Mother   . Asthma Mother     children   . Heart disease      father side of family  . Cancer Mother     Social History:  reports that she has never smoked. She has never used  smokeless tobacco. She reports that she drinks alcohol. She reports that she does not use illicit drugs.  Review of Systems: negative except as above   Blood pressure 139/40, pulse 85, temperature 99 F (37.2 C), temperature source Oral, resp. rate 16, SpO2 97 %. Head: Normocephalic, without obvious abnormality, atraumatic Neck: no adenopathy, no carotid bruit, no JVD, supple, symmetrical, trachea midline and thyroid not enlarged, symmetric, no tenderness/mass/nodules Resp: clear to auscultation bilaterally Cardio: regular rate and rhythm, S1, S2 normal, no murmur, click, rub or gallop GI: Orderly obese white female in no acute distress abdomen soft distended with normoactive bowel sounds. No hepatomegaly masses or guarding Extremities: extremities normal, atraumatic, no cyanosis or edema  Results for orders placed or performed during the hospital encounter of 11/21/14 (from the past 48 hour(s))  CBC with Differential     Status: Abnormal   Collection Time: 11/21/14  6:10 PM  Result Value Ref Range   WBC 9.9 4.0 - 10.5 K/uL   RBC 2.41 (L) 3.87 - 5.11 MIL/uL   Hemoglobin 7.7 (L) 12.0 - 15.0 g/dL   HCT 23.1 (L) 36.0 - 46.0 %   MCV 95.9 78.0 - 100.0 fL   MCH 32.0 26.0 - 34.0 pg   MCHC 33.3 30.0 - 36.0 g/dL   RDW 15.4 11.5 - 15.5 %   Platelets 136 (L) 150 - 400 K/uL   Neutrophils Relative % 78 (H)  43 - 77 %   Neutro Abs 7.8 (H) 1.7 - 7.7 K/uL   Lymphocytes Relative 14 12 - 46 %   Lymphs Abs 1.4 0.7 - 4.0 K/uL   Monocytes Relative 6 3 - 12 %   Monocytes Absolute 0.6 0.1 - 1.0 K/uL   Eosinophils Relative 1 0 - 5 %   Eosinophils Absolute 0.1 0.0 - 0.7 K/uL   Basophils Relative 1 0 - 1 %   Basophils Absolute 0.1 0.0 - 0.1 K/uL  Comprehensive metabolic panel     Status: Abnormal   Collection Time: 11/21/14  6:10 PM  Result Value Ref Range   Sodium 140 135 - 145 mmol/L   Potassium 4.0 3.5 - 5.1 mmol/L   Chloride 108 101 - 111 mmol/L   CO2 22 22 - 32 mmol/L   Glucose, Bld 150 (H) 65  - 99 mg/dL   BUN 15 6 - 20 mg/dL   Creatinine, Ser 0.70 0.44 - 1.00 mg/dL   Calcium 8.8 (L) 8.9 - 10.3 mg/dL   Total Protein 5.2 (L) 6.5 - 8.1 g/dL   Albumin 2.6 (L) 3.5 - 5.0 g/dL   AST 78 (H) 15 - 41 U/L   ALT 43 14 - 54 U/L   Alkaline Phosphatase 68 38 - 126 U/L   Total Bilirubin 1.4 (H) 0.3 - 1.2 mg/dL   GFR calc non Af Amer >60 >60 mL/min   GFR calc Af Amer >60 >60 mL/min    Comment: (NOTE) The eGFR has been calculated using the CKD EPI equation. This calculation has not been validated in all clinical situations. eGFR's persistently <60 mL/min signify possible Chronic Kidney Disease.    Anion gap 10 5 - 15  Protime-INR     Status: Abnormal   Collection Time: 11/21/14  6:10 PM  Result Value Ref Range   Prothrombin Time 16.6 (H) 11.6 - 15.2 seconds   INR 1.33 0.00 - 1.49  Lipase, blood     Status: Abnormal   Collection Time: 11/21/14  6:10 PM  Result Value Ref Range   Lipase 62 (H) 22 - 51 U/L  I-Stat Chem 8, ED     Status: Abnormal   Collection Time: 11/21/14  6:19 PM  Result Value Ref Range   Sodium 140 135 - 145 mmol/L   Potassium 3.9 3.5 - 5.1 mmol/L   Chloride 108 101 - 111 mmol/L   BUN 13 6 - 20 mg/dL   Creatinine, Ser 0.70 0.44 - 1.00 mg/dL   Glucose, Bld 145 (H) 65 - 99 mg/dL   Calcium, Ion 1.14 1.13 - 1.30 mmol/L   TCO2 19 0 - 100 mmol/L   Hemoglobin 7.1 (L) 12.0 - 15.0 g/dL   HCT 21.0 (L) 36.0 - 46.0 %   Ct Abdomen Pelvis W Contrast  11/21/2014   CLINICAL DATA:  Abdominal pain and GI bleeding. Vomiting blood. Initial encounter.  EXAM: CT ABDOMEN AND PELVIS WITH CONTRAST  TECHNIQUE: Multidetector CT imaging of the abdomen and pelvis was performed using the standard protocol following bolus administration of intravenous contrast.  CONTRAST:  137mL OMNIPAQUE IOHEXOL 300 MG/ML  SOLN  COMPARISON:  01/27/2014.  FINDINGS: Musculoskeletal: No aggressive osseous lesions. Lumbar spondylosis is expected for age. No AVN.  Lung Bases: Atelectasis.  Liver: Hepatic cirrhosis  is present. Discrete mass lesion. No change from recent MRI.  Spleen:  Chronic splenomegaly.  Gallbladder:  Within normal limits.  Common bile duct:  Normal.  Pancreas:  Fatty infiltration.  Adrenal glands:  Normal bilaterally.  Kidneys: Normal enhancement and delayed excretion of contrast. LEFT ureter normal. RIGHT ureter normal.  Stomach: Distended with food and fluid. Oral contrast is present in the antrum.  Small bowel:  No small bowel obstruction or inflammatory changes.  Colon:   Diverticulosis without diverticulitis.  Pelvic Genitourinary: Hysterectomy. Urinary bladder appears normal.  Peritoneum: No ascites.  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 Lymphadenopathy is also present in the upper abdomen secondary to cirrhosis and portal hypertension. The main portal vein is patent. Abdominal aortic atherosclerosis. Esophageal varices are also present although less pronounced and gastric varices.  Body Wall: Infiltration of the subcutaneous fat of the abdominal wall which may represent panniculitis in the appropriate setting. Ventral hernia repair.  IMPRESSION: 1. Hepatic cirrhosis with gastric varices. Esophageal varices are also present. Both of these may represent sources of hematemesis. 2. Portal venous hypertension and splenomegaly.   Electronically Signed   By: Dereck Ligas M.D.   On: 11/21/2014 19:48    Assessment: Reported hematemesis occurring after recent apparent upper GI bleeding with minimal findings on EGD. Gastric varices seen on CT today with less prominent esophageal varices. Plan:  Will admit will hold nothing by mouth and presumably pursue EGD tomorrow. Begin octreotide and Protonix. Transfuse 1 unit packed red blood cells Solyana Nonaka C 11/21/2014, 9:11 PM  Pager [phone] If no answer or after 5 PM call [phone]

## 2014-11-21 NOTE — ED Notes (Signed)
Pt able to ambulate to BR and back to bed with wheelchair at this time.

## 2014-11-21 NOTE — Progress Notes (Signed)
Attempted to call ED to obtain report, placed on hold.  When nurse able to call report I'm available at extension [phone].  Jewel Baize Faelynn Wynder,RN,BSN,CCRN

## 2014-11-21 NOTE — ED Notes (Signed)
Pt has returned from CT.  

## 2014-11-21 NOTE — ED Notes (Signed)
Patient came in via EMS.  She states she was vomiting up bright blood, and it was clotty since today at 3:30 pm.  She ate at 2:30 pm and she started feeling bad.  She had two black bowel movements since 2:45 pm.  She is nausea.

## 2014-11-22 DIAGNOSIS — D62 Acute posthemorrhagic anemia: Secondary | ICD-10-CM

## 2014-11-22 DIAGNOSIS — K922 Gastrointestinal hemorrhage, unspecified: Principal | ICD-10-CM

## 2014-11-22 DIAGNOSIS — K7030 Alcoholic cirrhosis of liver without ascites: Secondary | ICD-10-CM

## 2014-11-22 DIAGNOSIS — R040 Epistaxis: Secondary | ICD-10-CM

## 2014-11-22 LAB — CBC
HCT: 19.6 % — ABNORMAL LOW (ref 36.0–46.0)
HCT: 21.9 % — ABNORMAL LOW (ref 36.0–46.0)
HCT: 23.1 % — ABNORMAL LOW (ref 36.0–46.0)
HEMOGLOBIN: 6.5 g/dL — AB (ref 12.0–15.0)
Hemoglobin: 7.1 g/dL — ABNORMAL LOW (ref 12.0–15.0)
Hemoglobin: 7.6 g/dL — ABNORMAL LOW (ref 12.0–15.0)
MCH: 31.9 pg (ref 26.0–34.0)
MCH: 30.1 pg (ref 26.0–34.0)
MCH: 31 pg (ref 26.0–34.0)
MCHC: 33.2 g/dL (ref 30.0–36.0)
MCHC: 32.4 g/dL (ref 30.0–36.0)
MCHC: 32.9 g/dL (ref 30.0–36.0)
MCV: 96.1 fL (ref 78.0–100.0)
MCV: 92.8 fL (ref 78.0–100.0)
MCV: 94.3 fL (ref 78.0–100.0)
PLATELETS: 97 10*3/uL — AB (ref 150–400)
Platelets: 109 10*3/uL — ABNORMAL LOW (ref 150–400)
Platelets: 127 10*3/uL — ABNORMAL LOW (ref 150–400)
RBC: 2.04 MIL/uL — ABNORMAL LOW (ref 3.87–5.11)
RBC: 2.36 MIL/uL — ABNORMAL LOW (ref 3.87–5.11)
RBC: 2.45 MIL/uL — ABNORMAL LOW (ref 3.87–5.11)
RDW: 15.6 % — AB (ref 11.5–15.5)
RDW: 16.1 % — AB (ref 11.5–15.5)
RDW: 16.2 % — ABNORMAL HIGH (ref 11.5–15.5)
WBC: 7 10*3/uL (ref 4.0–10.5)
WBC: 7 10*3/uL (ref 4.0–10.5)
WBC: 6.2 10*3/uL (ref 4.0–10.5)

## 2014-11-22 LAB — GLUCOSE, CAPILLARY
GLUCOSE-CAPILLARY: 106 mg/dL — AB (ref 65–99)
GLUCOSE-CAPILLARY: 119 mg/dL — AB (ref 65–99)
GLUCOSE-CAPILLARY: 136 mg/dL — AB (ref 65–99)
GLUCOSE-CAPILLARY: 140 mg/dL — AB (ref 65–99)
GLUCOSE-CAPILLARY: 82 mg/dL (ref 65–99)
Glucose-Capillary: 144 mg/dL — ABNORMAL HIGH (ref 65–99)
Glucose-Capillary: 148 mg/dL — ABNORMAL HIGH (ref 65–99)
Glucose-Capillary: 116 mg/dL — ABNORMAL HIGH (ref 65–99)

## 2014-11-22 LAB — BASIC METABOLIC PANEL
ANION GAP: 7 (ref 5–15)
Anion gap: 5 (ref 5–15)
BUN: 22 mg/dL — ABNORMAL HIGH (ref 6–20)
BUN: 22 mg/dL — ABNORMAL HIGH (ref 6–20)
CHLORIDE: 109 mmol/L (ref 101–111)
CO2: 23 mmol/L (ref 22–32)
CO2: 24 mmol/L (ref 22–32)
Calcium: 8.4 mg/dL — ABNORMAL LOW (ref 8.9–10.3)
Calcium: 8.3 mg/dL — ABNORMAL LOW (ref 8.9–10.3)
Chloride: 110 mmol/L (ref 101–111)
Creatinine, Ser: 0.74 mg/dL (ref 0.44–1.00)
Creatinine, Ser: 0.9 mg/dL (ref 0.44–1.00)
GFR calc Af Amer: >60 mL/min (ref >60)
GFR calc Af Amer: >60 mL/min (ref >60)
GFR calc non Af Amer: >60 mL/min (ref >60)
Glucose, Bld: 158 mg/dL — ABNORMAL HIGH (ref 65–99)
Glucose, Bld: 118 mg/dL — ABNORMAL HIGH (ref 65–99)
Potassium: 4.7 mmol/L (ref 3.5–5.1)
Potassium: 4.2 mmol/L (ref 3.5–5.1)
Sodium: 139 mmol/L (ref 135–145)
Sodium: 139 mmol/L (ref 135–145)

## 2014-11-22 LAB — BLOOD GAS, ARTERIAL
Acid-Base Excess: 0.7 mmol/L (ref 0.0–2.0)
BICARBONATE: 23.8 meq/L (ref 20.0–24.0)
Drawn by: 31814
FIO2: 0.21
O2 Saturation: 96.2 %
PATIENT TEMPERATURE: 98.6
PCO2 ART: 33.5 mmHg — AB (ref 35.0–45.0)
PH ART: 7.465 — AB (ref 7.350–7.450)
TCO2: 22.5 mmol/L (ref 0–100)
pO2, Arterial: 79.1 mmHg — ABNORMAL LOW (ref 80.0–100.0)

## 2014-11-22 LAB — MRSA PCR SCREENING: MRSA by PCR: NEGATIVE

## 2014-11-22 LAB — PREPARE RBC (CROSSMATCH)

## 2014-11-22 LAB — AMMONIA: Ammonia: 148 umol/L — ABNORMAL HIGH (ref 9–35)

## 2014-11-22 MED ORDER — OXYMETAZOLINE HCL 0.05 % NA SOLN
1.0000 | Freq: Two times a day (BID) | NASAL | Status: DC
  Administered 2014-11-22 – 2014-11-25 (×5): 1 via NASAL
  Filled 2014-11-22: qty 15

## 2014-11-22 MED ORDER — LACTULOSE 10 GM/15ML PO SOLN
30.0000 g | Freq: Two times a day (BID) | ORAL | Status: DC
  Administered 2014-11-22 – 2014-11-25 (×6): 30 g via ORAL
  Filled 2014-11-22 (×8): qty 45

## 2014-11-22 MED ORDER — DEXTROSE 50 % IV SOLN
INTRAVENOUS | Status: AC
  Administered 2014-11-22: 25 mL
  Filled 2014-11-22: qty 50

## 2014-11-22 MED ORDER — SODIUM CHLORIDE 0.9 % IV SOLN: Freq: Once | INTRAVENOUS | Status: DC

## 2014-11-22 MED ORDER — LIDOCAINE HCL (PF) 2 % IJ SOLN
10.0000 mL | Freq: Once | INTRAMUSCULAR | Status: DC
  Filled 2014-11-22: qty 10

## 2014-11-22 MED ORDER — SALINE SPRAY 0.65 % NA SOLN
2.0000 | Freq: Four times a day (QID) | NASAL | Status: DC
  Administered 2014-11-22 – 2014-11-25 (×10): 2 via NASAL
  Filled 2014-11-22: qty 44

## 2014-11-22 MED ORDER — LACTULOSE ENEMA
300.0000 mL | Freq: Once | ORAL | Status: AC
  Administered 2014-11-22: 300 mL via RECTAL
  Filled 2014-11-22: qty 300

## 2014-11-22 MED ORDER — MUPIROCIN 2 % EX OINT
TOPICAL_OINTMENT | Freq: Two times a day (BID) | CUTANEOUS | Status: DC
  Administered 2014-11-22 – 2014-11-25 (×6): via NASAL
  Filled 2014-11-22: qty 22

## 2014-11-22 NOTE — Progress Notes (Signed)
RT placed pt on auto titrate CPAP 5-20cmH2O on room air via ffm. Pt woke up briefly and stated she was comfortable. RT will continue to monitor as needed.

## 2014-11-22 NOTE — Progress Notes (Addendum)
[facility] PROGRESS NOTE Interim History: 74 year old with past medical history ofShe relates that before coming into the hospital she had 2 episodes of nosebleeds, before the hematemesis. Thyroid Liver Cirrhosis, with an EGD on 11/16/2011 that showed no source of bleeding, alcohol dependence with ongoing alcohol abuse. 2 episodes of bleeding before coming to the hospital.  Assessment/Plan: Acute blood loss anemia/*Upper GI bleed: - Acute upper GI bleed with recent EGD on 11/16/2011. Previous hemoglobin 8.0 on admission 8.6 she is status post 1 unit of packed red blood cells on 11/22/2014. - Currently on octreotide and transfusing 1 unit of packed  red blood cells.  - NPO - will need refferal to ENT.  Essential Hypertension - hold antihypertensive medications.  Controlled   Diabetes - cont long acting insulin plus SSI.  Hypothyroid - cont synthroid.  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    Code Status: full Family Communication: son and daughter  Disposition Plan: inpatient   Consultants:  GI  Procedures:  CT abd  Antibiotics:  none  HPI/Subjective: No further abd pain  Objective: Filed Vitals:   11/22/14 0410 11/22/14 0622 11/22/14 0637 11/22/14 0652  BP: 144/40 159/46  151/46  Pulse:  87 87 86  Temp: 98.4 F (36.9 C) 98.8 F (37.1 C)  98.7 F (37.1 C)  TempSrc: Oral Oral  Oral  Resp: Height:      Weight:      SpO2:  98% 98% 96%    Intake/Output Summary (Last 24 hours) at 11/22/14 0716 Last data filed at 11/22/14 1610  Gross per 24 hour  Intake 1696.25 ml  Output      0 ml  Net 1696.25 ml   Filed Weights   11/21/14 2200  Weight: 126.9 kg (279 lb 12.2 oz)    Exam:  General: Alert, awake, oriented x3, in no acute distress.  HEENT: No bruits, no goiter.  Heart: Regular rate and rhythm. Lungs: Good air movement,  clear Abdomen: Soft, nontender, nondistended, positive bowel sounds.  Neuro: Grossly intact, nonfocal.   Data Reviewed: Basic Metabolic Panel:  Recent Labs Lab 11/15/14 1206 11/16/14 0450 11/16/14 1235 11/17/14 1104 11/21/14 1810 11/21/14 1819 11/22/14 0530  NA 134* 137 142 138 140 140 139  K 4.5 4.0 4.0 3.4* 4.0 3.9 4.7  CL 102 108  --  107 108 108 109  CO2 21* 26  --  25 22  --  23  GLUCOSE 410* 302* 234* 222* 150* 145* 158*  BUN 38* 40*  --  23* 15 13 22*  CREATININE 0.85 0.73  --  0.73 0.70 0.70 0.74  CALCIUM 9.1 8.2*  --  7.9* 8.8*  --  8.4*  MG  --  1.4*  --   --   --   --   --    Liver Function Tests:  Recent Labs Lab 11/15/14 1206 11/16/14 0450 11/21/14 1810  AST 58* 56* 78*  ALT 30 27 43  ALKPHOS 63 51 68  BILITOT 2.2* 1.7* 1.4*  PROT 5.4* 5.1* 5.2*  ALBUMIN 2.6* 2.6* 2.6*    Recent Labs Lab 11/15/14 1206 11/21/14 1810  LIPASE 28 62*   No results for input(s): AMMONIA in the last 168 hours. CBC:  Recent Labs Lab 11/16/14 1722 11/17/14 0529 11/17/14 1104 11/21/14 1810 11/21/14 1819 11/21/14 2258 11/22/14 0530  WBC 6.9 6.9 5.6 9.9  --   --  7.0  NEUTROABS  --   --   --  7.8*  --   --   --   HGB 7.9* 7.8* 8.0* 7.7* 7.1* 7.0* 6.5*  HCT 23.5* 24.8* 24.3* 23.1* 21.0* 21.3* 19.6*  MCV 95.1 94.7 95.7 95.9  --   --  96.1  PLT 84* 92* 82* 136*  --   --  97*   Cardiac Enzymes: No results for input(s): CKTOTAL, CKMB, CKMBINDEX, TROPONINI in the last 168 hours. BNP (last 3 results) No results for input(s): BNP in the last 8760 hours.  ProBNP (last 3 results) No results for input(s): PROBNP in the last 8760 hours.  CBG:  Recent Labs Lab 11/17/14 0456 11/17/14 0746 11/17/14 1135 11/22/14 0135 11/22/14 0359  GLUCAP 84 145* 200* 119* 144*    Recent Results (from the past 240 hour(s))  MRSA PCR Screening     Status: None   Collection Time: 11/15/14 11:18 AM  Result Value Ref Range Status   MRSA by PCR NEGATIVE NEGATIVE Final     Comment:        The GeneXpert MRSA Assay (FDA approved for NASAL specimens only), is one component of a comprehensive MRSA colonization surveillance program. It is not intended to diagnose MRSA infection nor to guide or monitor treatment for MRSA infections.   MRSA PCR Screening     Status: None   Collection Time: 11/21/14 10:51 PM  Result Value Ref Range Status   MRSA by PCR NEGATIVE NEGATIVE Final    Comment:        The GeneXpert MRSA Assay (FDA approved for NASAL specimens only), is one component of a comprehensive MRSA colonization surveillance program. It is not intended to diagnose MRSA infection nor to guide or monitor treatment for MRSA infections.      Studies: Ct Abdomen Pelvis W Contrast  11/21/2014   CLINICAL DATA:  Abdominal pain and GI bleeding. Vomiting blood. Initial encounter.  EXAM: CT ABDOMEN AND PELVIS WITH CONTRAST  TECHNIQUE: Multidetector CT imaging of the abdomen and pelvis was performed using the standard protocol following bolus administration of intravenous contrast.  CONTRAST:  OMNIPAQUE IOHEXOL 300 MG/ML  SOLN  COMPARISON:  01/27/2014.  FINDINGS: Musculoskeletal: No aggressive osseous lesions. Lumbar spondylosis is expected for age. No AVN.  Lung Bases: Atelectasis.  Liver: Hepatic cirrhosis is present. Discrete mass lesion. No change from recent MRI.  Spleen:  Chronic splenomegaly.  Gallbladder:  Within normal limits.  Common bile duct:  Normal.  Pancreas:  Fatty infiltration.  Adrenal glands:  Normal bilaterally.  Kidneys: Normal enhancement and delayed excretion of contrast. LEFT ureter normal. RIGHT ureter normal.  Stomach: Distended with food and fluid. Oral contrast is present in the antrum.  Small bowel:  No small bowel obstruction or inflammatory changes.  Colon:   Diverticulosis without diverticulitis.  Pelvic Genitourinary: Hysterectomy. Urinary bladder appears normal.  Peritoneum: No ascites.  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 Lymphadenopathy is also present in the upper abdomen secondary to cirrhosis and portal hypertension. The main portal vein is patent. Abdominal aortic atherosclerosis. Esophageal varices are also present although less pronounced and gastric varices.  Body Wall: Infiltration of the subcutaneous fat of the abdominal wall which may represent panniculitis in the appropriate setting. Ventral hernia repair.  IMPRESSION: 1. Hepatic cirrhosis with gastric varices. Esophageal varices are also present. Both of these may represent sources of hematemesis. 2. Portal venous hypertension and splenomegaly.   Electronically Signed   By: Juliene Pina  Lamke M.D.   On: 11/21/2014 19:48    Scheduled Meds: . sodium chloride   Intravenous Once  . FLUoxetine  20 mg Oral QPM  . insulin aspart  0-9 Units Subcutaneous 6 times per day  . insulin detemir  40 Units Subcutaneous BID  . levothyroxine  137 mcg Oral QAC breakfast  . [START ON 11/25/2014] pantoprazole (PROTONIX) IV  40 mg Intravenous Q12H   Continuous Infusions: . sodium chloride 75 mL/hr at 11/22/14 0600  . octreotide  (SANDOSTATIN)    IV infusion 50 mcg/hr (11/21/14 2328)  . pantoprozole (PROTONIX) infusion 8 mg/hr (11/21/14 2327)    Time Spent: 25 min   Marinda Elk  [facility] Pager [phone]. If 7PM-7AM, please contact night-coverage at [email or web address], password [facility] 11/22/2014, 7:16 AM  LOS: 1 day

## 2014-11-22 NOTE — Progress Notes (Signed)
[facility]  Patient Status: Active (receiving services up to time of hospitalization)  [facility] is providing the following services: RN and PT  If patient discharges after hours, please call [phone].   Lanae Crumbly 11/22/2014, 1:11 PM

## 2014-11-22 NOTE — Care Management Note (Signed)
Case Management Note  Patient Details  Name: Martha Wise MRN: [id number] Date of Birth: 07/17/1940  Subjective/Objective:                 Upper gi bleed and etoh abuse   Action/Plan: Date:  November 22, 2014 U.R. performed for needs and level of care. Will continue to follow for Case Management needs.  Marcelle Smiling, RN, BSN, [place]   [phone]  Expected Discharge Date:                  Expected Discharge Plan:  Home/Self Care  In-House Referral:  Clinical Social Work  Discharge planning Services  CM Consult  Post Acute Care Choice:  NA Choice offered to:  NA  DME Arranged:  N/A DME Agency:  NA  HH Arranged:  NA HH Agency:  NA  Status of Service:  In process, will continue to follow  Medicare Important Message Given:    Date Medicare IM Given:    Medicare IM give by:    Date Additional Medicare IM Given:    Additional Medicare Important Message give by:     If discussed at Long Length of Stay Meetings, dates discussed:    Additional Comments:  Golda Acre, RN 11/22/2014, 11:19 AM

## 2014-11-22 NOTE — Progress Notes (Signed)
[name] just released from hospital about 5 days ago, was admitted again through the emergency room yesterday evening because of 2 episodes of moderate volume hematemesis (on the order of a cup of blood, according to the patient's daughter, in contrast to the patient's report of a tablespoon or so). There was no syncope, but apparently the patient felt and looked somewhat weak and washed out.  Since admission, the patient has apparently had one further episode of minor hematemesis, but none today. She had a nonbloody bowel movement a short while ago, per discussion with the nurse. She is on octreotide and a Protonix infusion. Her vital signs have been stable and she feels okay except for feeling weak.  The patient has a history of cirrhosis radiographically demonstrated, never biopsied, attributed to a combination of nonalcoholic steatohepatitis and excessive ethanol consumption. Prior to her last admission, when she presented with large-volume hematemesis, she had never had any evident acute complications from the liver disease.  The patient has not had alcohol since been admitted to the hospital last week. Prior to that, however, she had heavy daily consumption, I think roughly 1 bottle of wine per day, except for the month of June when she managed to temporarily avoid alcohol consumption.  On that admission, endoscopy was performed (approximately 6 days ago) which was essentially normal, specifically without evidence of a source of bleeding such as esophageal varices, gastric varices, or evident portal gastropathy. On the other hand, the patient's CT scan on that admission did show evidence of gastrosplenic varices and smaller esophageal varices.  The patient had previously been on a daily 81 mg aspirin but that medication has been held since that admission, and as noted, there was no evidence of any aspirin gastropathy at the time of her recent endoscopy.  Exam: Vital signs normal. Mild  to moderate pallor. Skin warm. Abdomen without mass or tenderness, flank tympany present consistent with absence of ascites, no major lower extremity edema. Mental status moderately impaired, specifically, she is alert and appropriate, but cannot do serial twos, and asterixis is present.    Labs: Pertinent for pretransfusion hemoglobin of 6.5, normal renal function, nearly normal platelets (136,000), normal INR.  Impression:  1. Small volume hematemesis with roughly 1 g drop in hemoglobin in a patient with known cirrhosis, radiographic evidence of early to moderate varices, but negative endoscopy for varices or other bleeding sources as of a week ago, in a patient with a history of recurring nose bleeds.  2. Probable hepatic encephalopathy  Plan:  1. Consider ENT consultation, ideally while she is still an inpatient but if necessary as an outpatient, to try to ascertain whether her nosebleed may have accounted for her hematemesis. I have placed a call to the ENT doctor on call to see if they feel in-house consultation would be helpful in sorting out the source of the patient's bleeding.``  2. Okay from my standpoint to transfer to the floor and allow clear liquids, as ordered  3. Lengthy discussion with the patient and the patient's daughter, Martha Wise, and son, Martha Wise, regarding natural history of liver disease, importance of alcohol avoidance, and the uncertainty as to whether her bleeding is of hepatic origin.  4. I do not feel that repeat endoscopy at this time would likely clarify the patient's clinical picture, but I would be open to doing that if she shows evidence of further significant bleeding during this admission.  Martha Wise, M.D. Pager [phone] If no answer or after  5 PM call [phone]

## 2014-11-22 NOTE — Procedures (Signed)
Preop diagnosis: Hematemesis, epistaxis, anemia, cirrhosis Postop diagnosis: same Procedure: Flexible nasal endoscopy Surgeon: Jenne Pane Anesth: Topical with 2% lidocaine Compl: None Findings: Some moist crusting of anterior septum on both sides.  Streaked clotted blood originating from left inferior anterior septum extends posteriorly along the nasal floor and into the nasopharynx.  No active bleeding. Description: After discussing risks, benefits, and alternatives with her daughter, both nasal passages were sprayed with 2% lidocaine.  After a couple of minutes, the fiberoptic laryngoscope was passed through both nasal passages to evaluate both sides to the nasopharynx.  Findings are noted above.  The telescope was then removed and she was returned to nursing care for further evaluation of lethargy.

## 2014-11-22 NOTE — Consult Note (Signed)
Reason for Consult:Hematemsis, epistaxis Referring Physician: Dr. Lujean Martha Wise is an 74 y.o. female.  HPI: 74 year old female with one year history of recurring epistaxis.  Her last two episodes were minor and her last occurred yesterday.  She was admitted yesterday due to hematemesis and anemia and was transfused one PRBCs.  She was in the hospital one week ago with similar problems and an upper endoscopy was unremarkable for an active bleeding source.  She has a history of alcoholic cirrhosis and esophageal varices.  Consultation was requested in order to evaluate for epistaxis.  She wears CPAP at home with humidity.  Past Medical History  Diagnosis Date  . OSA (obstructive sleep apnea)   . Asthma   . Hypertension   . Diabetes   . Allergic rhinitis   . Fibromyalgia   . Alcoholic cirrhosis   . Esophageal varices   . Hypothyroid     Past Surgical History  Procedure Laterality Date  . Abdominal hysterectomy    . Bladder tact    . Esophagogastroduodenoscopy N/A 11/16/2014    Procedure: ESOPHAGOGASTRODUODENOSCOPY (EGD);  Surgeon: Laurence Spates, MD;  Location: [facility];  Service: Endoscopy;  Laterality: N/A;  . Gastric varices banding N/A 11/16/2014    Procedure: GASTRIC VARICES BANDING;  Surgeon: Laurence Spates, MD;  Location: [facility];  Service: Endoscopy;  Laterality: N/A;    Family History  Problem Relation Age of Onset  . Allergies Mother   . Asthma Mother     children   . Heart disease      father side of family  . Cancer Mother     Social History:  reports that she has never smoked. She has never used smokeless tobacco. She reports that she drinks alcohol. She reports that she does not use illicit drugs.  Allergies:  Allergies  Allergen Reactions  . Phenergan [Promethazine Hcl] Swelling    Lip swelling  . Hydrocodone Other (See Comments)    Gi upset/ abdominal pain  . Glimepiride Anxiety    Caused extreme anxiety      Medications: I have  reviewed the patient's current medications.  Results for orders placed or performed during the hospital encounter of 11/21/14 (from the past 48 hour(s))  CBC with Differential     Status: Abnormal   Collection Time: 11/21/14  6:10 PM  Result Value Ref Range   WBC 9.9 4.0 - 10.5 K/uL   RBC 2.41 (L) 3.87 - 5.11 MIL/uL   Hemoglobin 7.7 (L) 12.0 - 15.0 g/dL   HCT 23.1 (L) 36.0 - 46.0 %   MCV 95.9 78.0 - 100.0 fL   MCH 32.0 26.0 - 34.0 pg   MCHC 33.3 30.0 - 36.0 g/dL   RDW 15.4 11.5 - 15.5 %   Platelets 136 (L) 150 - 400 K/uL   Neutrophils Relative % 78 (H) 43 - 77 %   Neutro Abs 7.8 (H) 1.7 - 7.7 K/uL   Lymphocytes Relative 14 12 - 46 %   Lymphs Abs 1.4 0.7 - 4.0 K/uL   Monocytes Relative 6 3 - 12 %   Monocytes Absolute 0.6 0.1 - 1.0 K/uL   Eosinophils Relative 1 0 - 5 %   Eosinophils Absolute 0.1 0.0 - 0.7 K/uL   Basophils Relative 1 0 - 1 %   Basophils Absolute 0.1 0.0 - 0.1 K/uL  Comprehensive metabolic panel     Status: Abnormal   Collection Time: 11/21/14  6:10 PM  Result Value Ref Range  Sodium 140 135 - 145 mmol/L   Potassium 4.0 3.5 - 5.1 mmol/L   Chloride 108 101 - 111 mmol/L   CO2 22 22 - 32 mmol/L   Glucose, Bld 150 (H) 65 - 99 mg/dL   BUN 15 6 - 20 mg/dL   Creatinine, Ser 0.70 0.44 - 1.00 mg/dL   Calcium 8.8 (L) 8.9 - 10.3 mg/dL   Total Protein 5.2 (L) 6.5 - 8.1 g/dL   Albumin 2.6 (L) 3.5 - 5.0 g/dL   AST 78 (H) 15 - 41 U/L   ALT 43 14 - 54 U/L   Alkaline Phosphatase 68 38 - 126 U/L   Total Bilirubin 1.4 (H) 0.3 - 1.2 mg/dL   GFR calc non Af Amer >60 >60 mL/min   GFR calc Af Amer >60 >60 mL/min    Comment: (NOTE) The eGFR has been calculated using the CKD EPI equation. This calculation has not been validated in all clinical situations. eGFR's persistently <60 mL/min signify possible Chronic Kidney Disease.    Anion gap 10 5 - 15  Protime-INR     Status: Abnormal   Collection Time: 11/21/14  6:10 PM  Result Value Ref Range   Prothrombin Time 16.6 (H) 11.6  - 15.2 seconds   INR 1.33 0.00 - 1.49  Lipase, blood     Status: Abnormal   Collection Time: 11/21/14  6:10 PM  Result Value Ref Range   Lipase 62 (H) 22 - 51 U/L  I-Stat Chem 8, ED     Status: Abnormal   Collection Time: 11/21/14  6:19 PM  Result Value Ref Range   Sodium 140 135 - 145 mmol/L   Potassium 3.9 3.5 - 5.1 mmol/L   Chloride 108 101 - 111 mmol/L   BUN 13 6 - 20 mg/dL   Creatinine, Ser 0.70 0.44 - 1.00 mg/dL   Glucose, Bld 145 (H) 65 - 99 mg/dL   Calcium, Ion 1.14 1.13 - 1.30 mmol/L   TCO2 19 0 - 100 mmol/L   Hemoglobin 7.1 (L) 12.0 - 15.0 g/dL   HCT 21.0 (L) 36.0 - 46.0 %  Ethanol     Status: None   Collection Time: 11/21/14  9:08 PM  Result Value Ref Range   Alcohol, Ethyl (B) <5 <5 mg/dL    Comment:        LOWEST DETECTABLE LIMIT FOR SERUM ALCOHOL IS 5 mg/dL FOR MEDICAL PURPOSES ONLY   Type and screen     Status: None (Preliminary result)   Collection Time: 11/21/14  9:49 PM  Result Value Ref Range   ABO/RH(D) O POS    Antibody Screen NEG    Sample Expiration 11/24/2014    Unit Number [id number]    Blood Component Type RED CELLS,LR    Unit division 00    Status of Unit ISSUED    Transfusion Status OK TO TRANSFUSE    Crossmatch Result Compatible    Unit Number [id number]    Blood Component Type RED CELLS,LR    Unit division 00    Status of Unit ALLOCATED    Transfusion Status OK TO TRANSFUSE    Crossmatch Result Compatible   Prepare RBC     Status: None   Collection Time: 11/21/14  9:50 PM  Result Value Ref Range   Order Confirmation ORDER PROCESSED BY BLOOD BANK   MRSA PCR Screening     Status: None   Collection Time: 11/21/14 10:51 PM  Result Value Ref Range   MRSA by  PCR NEGATIVE NEGATIVE    Comment:        The GeneXpert MRSA Assay (FDA approved for NASAL specimens only), is one component of a comprehensive MRSA colonization surveillance program. It is not intended to diagnose MRSA infection nor to guide or monitor treatment for MRSA  infections.   Hemoglobin and hematocrit, blood     Status: Abnormal   Collection Time: 11/21/14 10:58 PM  Result Value Ref Range   Hemoglobin 7.0 (L) 12.0 - 15.0 g/dL   HCT 21.3 (L) 36.0 - 46.0 %  Glucose, capillary     Status: Abnormal   Collection Time: 11/22/14  1:35 AM  Result Value Ref Range   Glucose-Capillary 119 (H) 65 - 99 mg/dL  Glucose, capillary     Status: Abnormal   Collection Time: 11/22/14  3:59 AM  Result Value Ref Range   Glucose-Capillary 144 (H) 65 - 99 mg/dL  Basic metabolic panel     Status: Abnormal   Collection Time: 11/22/14  5:30 AM  Result Value Ref Range   Sodium 139 135 - 145 mmol/L   Potassium 4.7 3.5 - 5.1 mmol/L    Comment: DELTA CHECK NOTED REPEATED TO VERIFY NO VISIBLE HEMOLYSIS    Chloride 109 101 - 111 mmol/L   CO2 23 22 - 32 mmol/L   Glucose, Bld 158 (H) 65 - 99 mg/dL   BUN 22 (H) 6 - 20 mg/dL   Creatinine, Ser 0.74 0.44 - 1.00 mg/dL   Calcium 8.4 (L) 8.9 - 10.3 mg/dL   GFR calc non Af Amer >60 >60 mL/min   GFR calc Af Amer >60 >60 mL/min    Comment: (NOTE) The eGFR has been calculated using the CKD EPI equation. This calculation has not been validated in all clinical situations. eGFR's persistently <60 mL/min signify possible Chronic Kidney Disease.    Anion gap 7 5 - 15  CBC     Status: Abnormal   Collection Time: 11/22/14  5:30 AM  Result Value Ref Range   WBC 7.0 4.0 - 10.5 K/uL   RBC 2.04 (L) 3.87 - 5.11 MIL/uL   Hemoglobin 6.5 (LL) 12.0 - 15.0 g/dL    Comment: REPEATED TO VERIFY CRITICAL RESULT CALLED TO, READ BACK BY AND VERIFIED WITH: S. EARLY RN AT 6004 ON 08.01.16 BY SHUEA    HCT 19.6 (L) 36.0 - 46.0 %   MCV 96.1 78.0 - 100.0 fL   MCH 31.9 26.0 - 34.0 pg   MCHC 33.2 30.0 - 36.0 g/dL   RDW 15.6 (H) 11.5 - 15.5 %   Platelets 97 (L) 150 - 400 K/uL    Comment: SPECIMEN CHECKED FOR CLOTS REPEATED TO VERIFY PLATELET COUNT CONFIRMED BY SMEAR   Prepare RBC     Status: None   Collection Time: 11/22/14  6:23 AM  Result  Value Ref Range   Order Confirmation ORDER PROCESSED BY BLOOD BANK   Glucose, capillary     Status: Abnormal   Collection Time: 11/22/14  7:54 AM  Result Value Ref Range   Glucose-Capillary 148 (H) 65 - 99 mg/dL  Glucose, capillary     Status: Abnormal   Collection Time: 11/22/14 12:09 PM  Result Value Ref Range   Glucose-Capillary 136 (H) 65 - 99 mg/dL  CBC     Status: Abnormal   Collection Time: 11/22/14  3:04 PM  Result Value Ref Range   WBC 7.0 4.0 - 10.5 K/uL    Comment: WHITE COUNT CONFIRMED ON SMEAR  RBC 2.36 (L) 3.87 - 5.11 MIL/uL   Hemoglobin 7.1 (L) 12.0 - 15.0 g/dL   HCT 21.9 (L) 36.0 - 46.0 %   MCV 92.8 78.0 - 100.0 fL   MCH 30.1 26.0 - 34.0 pg   MCHC 32.4 30.0 - 36.0 g/dL   RDW 16.1 (H) 11.5 - 15.5 %   Platelets 109 (L) 150 - 400 K/uL    Comment: CONSISTENT WITH PREVIOUS RESULT SPECIMEN CHECKED FOR CLOTS REPEATED TO VERIFY   Glucose, capillary     Status: None   Collection Time: 11/22/14  6:46 PM  Result Value Ref Range   Glucose-Capillary 82 65 - 99 mg/dL   Comment 1 Notify RN   Glucose, capillary     Status: Abnormal   Collection Time: 11/22/14  7:34 PM  Result Value Ref Range   Glucose-Capillary 140 (H) 65 - 99 mg/dL    Ct Abdomen Pelvis W Contrast  11/21/2014   CLINICAL DATA:  Abdominal pain and GI bleeding. Vomiting blood. Initial encounter.  EXAM: CT ABDOMEN AND PELVIS WITH CONTRAST  TECHNIQUE: Multidetector CT imaging of the abdomen and pelvis was performed using the standard protocol following bolus administration of intravenous contrast.  CONTRAST:  179mL OMNIPAQUE IOHEXOL 300 MG/ML  SOLN  COMPARISON:  01/27/2014.  FINDINGS: Musculoskeletal: No aggressive osseous lesions. Lumbar spondylosis is expected for age. No AVN.  Lung Bases: Atelectasis.  Liver: Hepatic cirrhosis is present. Discrete mass lesion. No change from recent MRI.  Spleen:  Chronic splenomegaly.  Gallbladder:  Within normal limits.  Common bile duct:  Normal.  Pancreas:  Fatty  infiltration.  Adrenal glands:  Normal bilaterally.  Kidneys: Normal enhancement and delayed excretion of contrast. LEFT ureter normal. RIGHT ureter normal.  Stomach: Distended with food and fluid. Oral contrast is present in the antrum.  Small bowel:  No small bowel obstruction or inflammatory changes.  Colon:   Diverticulosis without diverticulitis.  Pelvic Genitourinary: Hysterectomy. Urinary bladder appears normal.  Peritoneum: No ascites.  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 Lymphadenopathy is also present in the upper abdomen secondary to cirrhosis and portal hypertension. The main portal vein is patent. Abdominal aortic atherosclerosis. Esophageal varices are also present although less pronounced and gastric varices.  Body Wall: Infiltration of the subcutaneous fat of the abdominal wall which may represent panniculitis in the appropriate setting. Ventral hernia repair.  IMPRESSION: 1. Hepatic cirrhosis with gastric varices. Esophageal varices are also present. Both of these may represent sources of hematemesis. 2. Portal venous hypertension and splenomegaly.   Electronically Signed   By: Dereck Ligas M.D.   On: 11/21/2014 19:48    Review of Systems  Unable to perform ROS: mental status change   Blood pressure 160/54, pulse 81, temperature 97.9 F (36.6 C), temperature source Oral, resp. rate 14, height $RemoveBe'5\' 6"'yKioDeWdv$  (1.676 m), weight 126.9 kg (279 lb 12.2 oz), SpO2 98 %. Physical Exam  Constitutional: She is oriented to person, place, and time. She appears well-developed and well-nourished.  Lethargic but rousable and oriented.  HENT:  Head: Normocephalic and atraumatic.  Right Ear: External ear normal.  Left Ear: External ear normal.  Nose: Nose normal.  Mouth/Throat: Oropharynx is clear and moist.  Posterior oropharynx with small clot streak down midline.  Eyes: Conjunctivae and EOM are normal. Pupils are equal, round, and reactive to light.  Neck:  Normal range of motion. Neck supple.  Cardiovascular: Normal rate.   Respiratory: Effort normal.  Musculoskeletal: Normal range of motion.  Neurological: She is oriented to person, place, and time.  Lethargic.  Skin: Skin is warm and dry.    Assessment/Plan: Epistaxis, cirrhosis, anemia, hematemesis Fiberoptic exam of the nose was performed at the bedside.  See the procedure note.  It seems that nosebleeding is coming from the left anterior septum but is not currently active.  I will have her started on Afrin spray twice daily, nasal saline four times daily, and mupirocin ointment twice daily.  It is difficult to know if her anemia and hematemesis is certainly due to epistaxis but it is possible.  Currently, she is lethargic and being evaluated by rapid response.  If nosebleeding worsens or does not respond to topical medical treatments, further intervention with some form of cautery will be considered.  Martha Wise 11/22/2014, 8:20 PM

## 2014-11-23 ENCOUNTER — Inpatient Hospital Stay (HOSPITAL_COMMUNITY): Payer: Medicare Other

## 2014-11-23 ENCOUNTER — Ambulatory Visit (HOSPITAL_COMMUNITY): Payer: Medicare Other

## 2014-11-23 LAB — GLUCOSE, CAPILLARY
GLUCOSE-CAPILLARY: 83 mg/dL (ref 65–99)
GLUCOSE-CAPILLARY: 84 mg/dL (ref 65–99)
GLUCOSE-CAPILLARY: 171 mg/dL — AB (ref 65–99)
Glucose-Capillary: 87 mg/dL (ref 65–99)
Glucose-Capillary: 119 mg/dL — ABNORMAL HIGH (ref 65–99)
Glucose-Capillary: 172 mg/dL — ABNORMAL HIGH (ref 65–99)

## 2014-11-23 LAB — CBC
HCT: 22 % — ABNORMAL LOW (ref 36.0–46.0)
Hemoglobin: 7 g/dL — ABNORMAL LOW (ref 12.0–15.0)
MCH: 29.8 pg (ref 26.0–34.0)
MCHC: 31.8 g/dL (ref 30.0–36.0)
MCV: 93.6 fL (ref 78.0–100.0)
Platelets: 108 10*3/uL — ABNORMAL LOW (ref 150–400)
RBC: 2.35 MIL/uL — AB (ref 3.87–5.11)
RDW: 16.3 % — AB (ref 11.5–15.5)
WBC: 5.9 10*3/uL (ref 4.0–10.5)

## 2014-11-23 LAB — COMPREHENSIVE METABOLIC PANEL
ALBUMIN: 2.5 g/dL — AB (ref 3.5–5.0)
ALK PHOS: 56 U/L (ref 38–126)
ALT: 41 U/L (ref 14–54)
AST: 83 U/L — ABNORMAL HIGH (ref 15–41)
Anion gap: 7 (ref 5–15)
BILIRUBIN TOTAL: 2.1 mg/dL — AB (ref 0.3–1.2)
BUN: 19 mg/dL (ref 6–20)
CALCIUM: 8.3 mg/dL — AB (ref 8.9–10.3)
CO2: 24 mmol/L (ref 22–32)
CREATININE: 0.86 mg/dL (ref 0.44–1.00)
Chloride: 111 mmol/L (ref 101–111)
GFR calc non Af Amer: >60 mL/min (ref >60)
Glucose, Bld: 83 mg/dL (ref 65–99)
Potassium: 3.9 mmol/L (ref 3.5–5.1)
SODIUM: 142 mmol/L (ref 135–145)
TOTAL PROTEIN: 4.9 g/dL — AB (ref 6.5–8.1)

## 2014-11-23 LAB — PREPARE RBC (CROSSMATCH)

## 2014-11-23 LAB — AMMONIA: Ammonia: 107 umol/L — ABNORMAL HIGH (ref 9–35)

## 2014-11-23 LAB — HEMOGLOBIN A1C
HEMOGLOBIN A1C: 6.3 % — AB (ref 4.8–5.6)
Mean Plasma Glucose: 134 mg/dL

## 2014-11-23 MED ORDER — LACTULOSE ENEMA
300.0000 mL | Freq: Three times a day (TID) | ORAL | Status: DC
  Administered 2014-11-23 – 2014-11-24 (×2): 300 mL via RECTAL
  Filled 2014-11-23 (×9): qty 300

## 2014-11-23 MED ORDER — SODIUM CHLORIDE 0.9 % IV SOLN
INTRAVENOUS | Status: DC
  Administered 2014-11-23 – 2014-11-24 (×2): via INTRAVENOUS

## 2014-11-23 MED ORDER — SODIUM CHLORIDE 0.9 % IV SOLN
Freq: Once | INTRAVENOUS | Status: DC
Start: 2014-11-23 — End: 2014-11-23

## 2014-11-23 MED ORDER — LACTULOSE ENEMA
300.0000 mL | Freq: Two times a day (BID) | ORAL | Status: DC
  Filled 2014-11-23 (×3): qty 300

## 2014-11-23 NOTE — Progress Notes (Signed)
hgb stable x 24hrs.  Evaluation by Dr. Jenne Pane of ENT is much appreciated. It showed definite evidence of a recent nosebleed, which may or may not account for the patient's recent hematemesis, but given her recently negative upper endoscopy, and her low platelet count, I think it's a definite possibility.  Elevated ammonia and lethargy last night noted; somewhat lower this morning following lactulose enemas, but patient is still obtunded, not really responsive to voice or even gentle tactile stimulation. She does have spontaneous purposeless movement. She is certainly more obtunded than she was yesterday.  Impression:  1. Hepatic encephalopathy, most likely related to blood in the GI tract releasing proteinaceous wastes. Doubt underlying infection (no ascites present on admission CT, therefore SBP is not a concern). Patient does not appear to be clinically dehydrated.  2. Recent hematemesis, possibly related to nosebleed as discussed above  Recommendation:  1. Okay to stop octreotide 2. May change Protonix to 40 mg orally once a day, once patient is awake enough to take oral medications 3. Continue treatment with lactulose enemas and, when awake enough, oral lactulose 4. I would follow ENT is recommendations  Martha Wise, M.D. Pager [phone] If no answer or after 5 PM call [phone]

## 2014-11-23 NOTE — Progress Notes (Addendum)
Shift event note:  Notified by RN regarding persistent lethargy. It is felt this has been progressing for most of the day but now pt difficult to arouse and when awakened she quickly falls back asleep. RR RN was paged and responded to bedside. She has not received any sedating medications this shift.  Pt has a h/o OSA however an ABG was obtained and revealed pH-7.46, pC02-33.5, p02-79.1 and bicarb of 23.8. At bedside pt noted lying in NAD. Skin w/d, color WNL. She will awaken when name is called but does not appear to track and is unable to keep eyes open. She is oriented to person only. She denies pain. She will follow simple commands at times and not at others. She would answer verbally "[facility]" when ask if she knows where she is and speech appears to be normal. There is no obvious facial droop or other objective focal findings. PEARRL. There is no respiratory distress. BBS CTA. With effort pt appears able to move all extremities x 4. VSS and pt is afebrile. CBC, CMP and ammonia level was drawn. CBC/CMP w/o significant changes. Ammonia level 148. Assessment/Plan: 1. Persistent lethargy: Hepatic encephalopathy in this 74 y/o female w/ alcoholic cirrhosis. Will start Lactulose enema's and switch to PO as soon as more alert. Keep NPO for now. Follow ammonia levels. Will continue to monitor closely on telemetry.   Leanne Chang, NP-C [facility] Pager [phone]

## 2014-11-23 NOTE — Progress Notes (Addendum)
[facility] PROGRESS NOTE Interim History: 74 year old with past medical history ofShe relates that before coming into the hospital she had 2 episodes of nosebleeds, before the hematemesis. Thyroid Liver Cirrhosis, with an EGD on 11/16/2011 that showed no source of bleeding, alcohol dependence with ongoing alcohol abuse. 2 episodes of bleeding before coming to the hospital.  Assessment/Plan: Acute blood loss anemia due to most likely nosebleeds: - Acute upper GI bleed with recent EGD on 11/16/2011. - Her hemoglobin is slowly trending down or go ahead and transfuse her one unit of packed red blood cells. - ENT bates found streaks of blood left anterior septum extends posteriorly, injected with lidocaine. - CBC am.  Acute hepatic encephalopathy: Her ammonia level was high she was started on lactulose twice a day and will go ahead and increase it to 3 times a day. Appreciate GIs assistance.  Essential Hypertension; - hold antihypertensive medications.  Controlled   Diabetes: - cont long acting insulin plus SSI.  Hypothyroid - cont synthroid.  Thrombocytopenia: - due to liver disease  EtOH dependence/ cirrhosis with alcoholism - D/c ativan.    Code Status: full Family Communication: son and daughter  Disposition Plan: inpatient   Consultants:  GI  Procedures:  CT abd  Antibiotics:  none  HPI/Subjective: Sleepy.  Objective: Filed Vitals:   11/22/14 1900 11/22/14 2145 11/23/14 0133 11/23/14 0341  BP: 160/54  152/56 153/50  Pulse: 81 84 85 81  Temp:   98.2 F (36.8 C) 98.7 F (37.1 C)  TempSrc:   Oral Axillary  Resp:  18 16 16   Height:      Weight:      SpO2: 98% 96% 98% 97%    Intake/Output Summary (Last 24 hours) at 11/23/14 1025 Last data filed at 11/22/14 1500  Gross per 24 hour  Intake    600 ml  Output    400 ml  Net    200 ml   Filed Weights   11/21/14 2200  Weight: 126.9 kg (279 lb 12.2 oz)    Exam:  General: , in no acute  distress.  HEENT: No bruits, no goiter.  Heart: Regular rate and rhythm. Lungs: Good air movement, clear Abdomen: Soft, nontender, nondistended, positive bowel sounds.  Neuro: Grossly intact, nonfocal. No asterixis.   Data Reviewed: Basic Metabolic Panel:  Recent Labs Lab 11/17/14 1104 11/21/14 1810 11/21/14 1819 11/22/14 0530 11/22/14 2110 11/23/14 0510  NA 138 140 140 139 139 142  K 3.4* 4.0 3.9 4.7 4.2 3.9  CL 107 108 108 109 110 111  CO2 25 22  --  23 24 24   GLUCOSE 222* 150* 145* 158* 118* 83  BUN 23* 15 13 22* 22* 19  CREATININE 0.73 0.70 0.70 0.74 0.90 0.86  CALCIUM 7.9* 8.8*  --  8.4* 8.3* 8.3*   Liver Function Tests:  Recent Labs Lab 11/21/14 1810 11/23/14 0510  AST 78* 83*  ALT 43 41  ALKPHOS 68 56  BILITOT 1.4* 2.1*  PROT 5.2* 4.9*  ALBUMIN 2.6* 2.5*    Recent Labs Lab 11/21/14 1810  LIPASE 62*    Recent Labs Lab 11/22/14 2110 11/23/14 0510  AMMONIA 148* 107*   CBC:  Recent Labs Lab 11/21/14 1810  11/21/14 2258 11/22/14 0530 11/22/14 1504 11/22/14 2110 11/23/14 0510  WBC 9.9  --   --  7.0 7.0 6.2 5.9  NEUTROABS 7.8*  --   --   --   --   --   --   HGB  7.7*  < > 7.0* 6.5* 7.1* 7.6* 7.0*  HCT 23.1*  < > 21.3* 19.6* 21.9* 23.1* 22.0*  MCV 95.9  --   --  96.1 92.8 94.3 93.6  PLT 136*  --   --  97* 109* 127* 108*  < > = values in this interval not displayed. Cardiac Enzymes: No results for input(s): CKTOTAL, CKMB, CKMBINDEX, TROPONINI in the last 168 hours. BNP (last 3 results) No results for input(s): BNP in the last 8760 hours.  ProBNP (last 3 results) No results for input(s): PROBNP in the last 8760 hours.  CBG:  Recent Labs Lab 11/22/14 2141 11/22/14 2343 11/23/14 0344 11/23/14 0536 11/23/14 0734  GLUCAP 116* 106* 83 84 87    Recent Results (from the past 240 hour(s))  MRSA PCR Screening     Status: None   Collection Time: 11/15/14 11:18 AM  Result Value Ref Range Status   MRSA by PCR NEGATIVE NEGATIVE Final     Comment:        The GeneXpert MRSA Assay (FDA approved for NASAL specimens only), is one component of a comprehensive MRSA colonization surveillance program. It is not intended to diagnose MRSA infection nor to guide or monitor treatment for MRSA infections.   MRSA PCR Screening     Status: None   Collection Time: 11/21/14 10:51 PM  Result Value Ref Range Status   MRSA by PCR NEGATIVE NEGATIVE Final    Comment:        The GeneXpert MRSA Assay (FDA approved for NASAL specimens only), is one component of a comprehensive MRSA colonization surveillance program. It is not intended to diagnose MRSA infection nor to guide or monitor treatment for MRSA infections.      Studies: Ct Abdomen Pelvis W Contrast  11/21/2014   CLINICAL DATA:  Abdominal pain and GI bleeding. Vomiting blood. Initial encounter.  EXAM: CT ABDOMEN AND PELVIS WITH CONTRAST  TECHNIQUE: Multidetector CT imaging of the abdomen and pelvis was performed using the standard protocol following bolus administration of intravenous contrast.  CONTRAST:  OMNIPAQUE IOHEXOL 300 MG/ML  SOLN  COMPARISON:  01/27/2014.  FINDINGS: Musculoskeletal: No aggressive osseous lesions. Lumbar spondylosis is expected for age. No AVN.  Lung Bases: Atelectasis.  Liver: Hepatic cirrhosis is present. Discrete mass lesion. No change from recent MRI.  Spleen:  Chronic splenomegaly.  Gallbladder:  Within normal limits.  Common bile duct:  Normal.  Pancreas:  Fatty infiltration.  Adrenal glands:  Normal bilaterally.  Kidneys: Normal enhancement and delayed excretion of contrast. LEFT ureter normal. RIGHT ureter normal.  Stomach: Distended with food and fluid. Oral contrast is present in the antrum.  Small bowel:  No small bowel obstruction or inflammatory changes.  Colon:   Diverticulosis without diverticulitis.  Pelvic Genitourinary: Hysterectomy. Urinary bladder appears normal.  Peritoneum: No ascites.  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 Lymphadenopathy is also present in the upper abdomen secondary to cirrhosis and portal hypertension. The main portal vein is patent. Abdominal aortic atherosclerosis. Esophageal varices are also present although less pronounced and gastric varices.  Body Wall: Infiltration of the subcutaneous fat of the abdominal wall which may represent panniculitis in the appropriate setting. Ventral hernia repair.  IMPRESSION: 1. Hepatic cirrhosis with gastric varices. Esophageal varices are also present. Both of these may represent sources of hematemesis. 2. Portal venous hypertension and splenomegaly.   Electronically Signed   By: Andreas Newport M.D.   On: 11/21/2014 19:48    Scheduled  Meds: . sodium chloride   Intravenous Once  . FLUoxetine  20 mg Oral QPM  . insulin aspart  0-9 Units Subcutaneous 6 times per day  . insulin detemir  40 Units Subcutaneous BID  . lactulose  30 g Oral BID  . lactulose  300 mL Rectal BID  . levothyroxine  137 mcg Oral QAC breakfast  . lidocaine  10 mL Infiltration Once  . mupirocin ointment   Nasal BID  . oxymetazoline  1 spray Each Nare BID  . [START ON 11/25/2014] pantoprazole (PROTONIX) IV  40 mg Intravenous Q12H  . sodium chloride  2 spray Each Nare QID   Continuous Infusions: . pantoprozole (PROTONIX) infusion 8 mg/hr (11/22/14 1107)    Time Spent: 25 min   FELIZ Rosine Beat  [facility] Pager [phone]. If 7PM-7AM, please contact night-coverage at [email or web address], password [facility] 11/23/2014, 10:25 AM  LOS: 2 days

## 2014-11-24 DIAGNOSIS — D696 Thrombocytopenia, unspecified: Secondary | ICD-10-CM

## 2014-11-24 DIAGNOSIS — F1020 Alcohol dependence, uncomplicated: Secondary | ICD-10-CM

## 2014-11-24 DIAGNOSIS — K7290 Hepatic failure, unspecified without coma: Secondary | ICD-10-CM

## 2014-11-24 LAB — TYPE AND SCREEN
ABO/RH(D): O POS
ANTIBODY SCREEN: NEGATIVE
UNIT DIVISION: 0
Unit division: 0

## 2014-11-24 LAB — GLUCOSE, CAPILLARY
GLUCOSE-CAPILLARY: 152 mg/dL — AB (ref 65–99)
GLUCOSE-CAPILLARY: 233 mg/dL — AB (ref 65–99)
Glucose-Capillary: 162 mg/dL — ABNORMAL HIGH (ref 65–99)
Glucose-Capillary: 133 mg/dL — ABNORMAL HIGH (ref 65–99)
Glucose-Capillary: 162 mg/dL — ABNORMAL HIGH (ref 65–99)
Glucose-Capillary: 212 mg/dL — ABNORMAL HIGH (ref 65–99)

## 2014-11-24 LAB — CBC
HCT: 24.1 % — ABNORMAL LOW (ref 36.0–46.0)
Hemoglobin: 7.9 g/dL — ABNORMAL LOW (ref 12.0–15.0)
MCH: 30.9 pg (ref 26.0–34.0)
MCHC: 32.8 g/dL (ref 30.0–36.0)
MCV: 94.1 fL (ref 78.0–100.0)
Platelets: 115 10*3/uL — ABNORMAL LOW (ref 150–400)
RBC: 2.56 MIL/uL — ABNORMAL LOW (ref 3.87–5.11)
RDW: 15.8 % — ABNORMAL HIGH (ref 11.5–15.5)
WBC: 5.5 10*3/uL (ref 4.0–10.5)

## 2014-11-24 LAB — AMMONIA: Ammonia: 86 umol/L — ABNORMAL HIGH (ref 9–35)

## 2014-11-24 MED ORDER — MOMETASONE FURO-FORMOTEROL FUM 100-5 MCG/ACT IN AERO
2.0000 | INHALATION_SPRAY | Freq: Two times a day (BID) | RESPIRATORY_TRACT | Status: DC
  Administered 2014-11-25: 2 via RESPIRATORY_TRACT
  Filled 2014-11-24 (×2): qty 8.8

## 2014-11-24 MED ORDER — METFORMIN HCL ER 500 MG PO TB24
1000.0000 mg | ORAL_TABLET | Freq: Every day | ORAL | Status: DC
  Administered 2014-11-25: 1000 mg via ORAL
  Filled 2014-11-24 (×2): qty 2

## 2014-11-24 MED ORDER — RIFAXIMIN 550 MG PO TABS
550.0000 mg | ORAL_TABLET | Freq: Three times a day (TID) | ORAL | Status: DC
  Filled 2014-11-24 (×2): qty 1

## 2014-11-24 MED ORDER — IRBESARTAN 75 MG PO TABS
37.5000 mg | ORAL_TABLET | Freq: Every day | ORAL | Status: DC
  Administered 2014-11-24 – 2014-11-25 (×2): 37.5 mg via ORAL
  Filled 2014-11-24 (×2): qty 0.5

## 2014-11-24 MED ORDER — RIFAXIMIN 550 MG PO TABS
550.0000 mg | ORAL_TABLET | Freq: Two times a day (BID) | ORAL | Status: DC
  Administered 2014-11-24 – 2014-11-25 (×2): 550 mg via ORAL
  Filled 2014-11-24 (×3): qty 1

## 2014-11-24 NOTE — Care Management Important Message (Signed)
Important Message  Patient Details  Name: Martha Wise MRN: [id number] Date of Birth: Jan 21, 1941   Medicare Important Message Given:  Yes-second notification given    [name] 11/24/2014, 1:06 PMImportant Message  Patient Details  Name: Martha Wise MRN: [id number] Date of Birth: [birth year]   Medicare Important Message Given:  Yes-second notification given    [name] 11/24/2014, 1:06 PM

## 2014-11-24 NOTE — Clinical Social Work Note (Signed)
Clinical Social Work Assessment  Patient Details  Name: Martha Wise MRN: [id number] Date of Birth: November 21, 1940  Date of referral:  11/24/14               Reason for consult:  Discharge Planning                Permission sought to share information with:  Family Supports Permission granted to share information::  Yes, Verbal Permission Granted  Name::     [place]::     Relationship::  dtr  Contact Information:     Housing/Transportation Living arrangements for the past 2 months:  Single Family Home Source of Information:  Patient, Adult Children Patient Interpreter Needed:  None Criminal Activity/Legal Involvement Pertinent to Current Situation/Hospitalization:  No - Comment as needed Significant Relationships:  Adult Children Lives with:  Self Do you feel safe going back to the place where you live?  No Need for family participation in patient care:  Yes (Comment)  Care giving concerns:  Patient's family is concerned about her returning home alone. Patient is agreeable to work with PT to determine needs.   Social Worker assessment / plan:  CSW received referral in order to assist with DC planning. Per MD, patient's family is requesting SNF placement. MD agreeable to place PT orders to determine patient's level of care needed. CSW reviewed chart and met with patient and dtr at bedside. CSW introduced myself and explained role.  Patient laying in bed and dtr present. Patient agreeable for family to be involved with assessment. Patient lives home alone and reports she was using a walker prior to admission. Patient agreeable to work with PT and reports if SNF placement is needed she would prefer placement at [facility]. CSW explained that prior authorization is needed for SNF placement so CSW would follow up after evaluation. CSW encouraged patient and family to review list and have alternative options in case [facility] is unable to accept. Patient agreeable for CSW to  fax out to [facility] if PT recommends SNF placement.  CSW will continue to follow.  Employment status:  Retired Nurse, adult PT Recommendations:  Not assessed at this time [place] / Referral to community resources:  [place]  Patient/Family's Response to care:  Patient engaged during assessment but has flat affect.  Patient/Family's Understanding of and Emotional Response to Diagnosis, Current Treatment, and Prognosis:  Patient reports she knows that she needs additional help but is hopeful that she is strong enough to return home without SNF placement. Patient is agreeable to placement if PT recommends SNF. Patient aware that alcohol is causing medical problems and hopes to stay sober. Patient has already reduced consumption. Patient declined to complete SBIRT and declined any resources at this time.  Emotional Assessment Appearance:  Appears stated age Attitude/Demeanor/Rapport:  Other (Cooperative) Affect (typically observed):  Appropriate Orientation:  Oriented to Place, Oriented to Self Alcohol / Substance use:  Alcohol Use Psych involvement (Current and /or in the community):  No (Comment)  Discharge Needs  Concerns to be addressed:  No discharge needs identified Readmission within the last 30 days:  Yes Current discharge risk:  Lives alone Barriers to Discharge:  Continued Medical Work up   [employer], [place] 11/24/2014, 3:55 PM [phone]

## 2014-11-24 NOTE — Progress Notes (Signed)
No evidence of further bleeding. Hemoglobin stable.  Large dark stool with lactulose enema. It is also now on oral lactulose.  Her ammonia level today is somewhat better, and her mental status is also improved. She is oriented to name and place, but not month or year. She cannot do any arithmetic. She did not cooperate well to check for asterixis, but I did not detect any on limited exam.  Impression:  1. 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 History of recurrent hematemesis, most likely related to accumulated blood in the stomach from nosebleeds 3. Hepatic encephalopathy, under treatment, gradually improving. Most likely, this is related to the blood in her GI tract.  Recommendation:  1. Continue management with lactulose and rifaximin 2. I will follow with you at a distance. Please call at any time if earlier input is needed.  Florencia Reasons, M.D. Pager [phone] If no answer or after 5 PM call [phone]

## 2014-11-24 NOTE — Clinical Social Work Placement (Signed)
   CLINICAL SOCIAL WORK PLACEMENT  NOTE  Date:  11/24/2014  Patient Details  Name: Martha Wise MRN: [id number] Date of Birth: Aug 18, 1940  Clinical Social Work is seeking post-discharge placement for this patient at the Skilled  Nursing Facility level of care (*CSW will initial, date and re-position this form in  chart as items are completed):  Yes   Patient/family provided with [facility] Clinical Social Work Department's list of facilities offering this level of care within the geographic area requested by the patient (or if unable, by the patient's family).  Yes   Patient/family informed of their freedom to choose among providers that offer the needed level of care, that participate in Medicare, Medicaid or managed care program needed by the patient, have an available bed and are willing to accept the patient.  Yes   Patient/family informed of 's ownership interest in [facility] and [facility], as well as of the fact that they are under no obligation to receive care at these facilities.  PASRR submitted to EDS on 11/24/14     PASRR number received on       Existing PASRR number confirmed on       FL2 transmitted to all facilities in geographic area requested by pt/family on 11/24/14     FL2 transmitted to all facilities within larger geographic area on       Patient informed that his/her managed care company has contracts with or will negotiate with certain facilities, including the following:            Patient/family informed of bed offers received.  Patient chooses bed at       Physician recommends and patient chooses bed at      Patient to be transferred to   on  .  Patient to be transferred to facility by       Patient family notified on   of transfer.  Name of family member notified:        PHYSICIAN       Additional Comment:    _______________________________________________ Marnee Spring, LCSW 11/24/2014, 4:00 PM

## 2014-11-24 NOTE — Progress Notes (Signed)
Spoke with pt concerning her CPAP. She wasn't sure when she would want to be connected, so I suggested that she call her RN when she was ready to sleep. Pt agreed.

## 2014-11-24 NOTE — Progress Notes (Signed)
[facility] PROGRESS NOTE Interim History: 74 year old with past medical history ofShe relates that before coming into the hospital she had 2 episodes of nosebleeds, before the hematemesis. Thyroid Liver Cirrhosis, with an EGD on 11/16/2011 that showed no source of bleeding, alcohol dependence with ongoing alcohol abuse. 2 episodes of bleeding before coming to the hospital.  Assessment/Plan: Acute blood loss anemia due to most likely nosebleeds: - Acute upper GI bleed with recent EGD on 11/16/2011. - S/p 1 unit of PRBC on 8.2.2016. - ENT bates found streaks of blood left anterior septum extends posteriorly, injected with lidocaine. - CBC am.  Acute hepatic encephalopathy: Her ammonia level was high she was started on lactulose twice a day and will go ahead and increase it to 3 times a day. Appreciate GIs assistance. Add rifaximin. Still with mild cognitive impairment.  Essential Hypertension; - hold antihypertensive medications.  Controlled   Diabetes: - cont long acting insulin plus SSI.  Hypothyroid - cont synthroid.  Thrombocytopenia: - due to liver disease  EtOH dependence/ cirrhosis with alcoholism - D/c ativan.    Code Status: full Family Communication: son and daughter  Disposition Plan: inpatient   Consultants:  GI  Procedures:  CT abd  Antibiotics:  none  HPI/Subjective: Awake no complains  Objective: Filed Vitals:   11/23/14 2350 11/24/14 0639 11/24/14 0755 11/24/14 1202  BP:  134/40 142/41 154/49  Pulse: 69 69 69 62  Temp:  97.8 F (36.6 C) 97.2 F (36.2 C) 98 F (36.7 C)  TempSrc:  Oral Oral Oral  Resp: Height:      Weight:      SpO2: 97% 100% 98% 99%    Intake/Output Summary (Last 24 hours) at 11/24/14 1405 Last data filed at 11/24/14 0923  Gross per 24 hour  Intake    850 ml  Output      0 ml  Net    850 ml   Filed Weights   11/21/14 2200  Weight: 126.9 kg (279 lb 12.2 oz)    Exam:  General: , in  no acute distress. A&O x2 HEENT: No bruits, no goiter.  Heart: Regular rate and rhythm. Lungs: Good air movement, clear Abdomen: Soft, nontender, nondistended, positive bowel sounds.  Neuro: Grossly intact, nonfocal. No asterixis.   Data Reviewed: Basic Metabolic Panel:  Recent Labs Lab 11/21/14 1810 11/21/14 1819 11/22/14 0530 11/22/14 2110 11/23/14 0510  NA 140 140 139 139 142  K 4.0 3.9 4.7 4.2 3.9  CL 108 108 109 110 111  CO2 22  --  GLUCOSE 150* 145* 158* 118* 83  BUN 15 13 22* 22* 19  CREATININE 0.70 0.70 0.74 0.90 0.86  CALCIUM 8.8*  --  8.4* 8.3* 8.3*   Liver Function Tests:  Recent Labs Lab 11/21/14 1810 11/23/14 0510  AST 78* 83*  ALT 43 41  ALKPHOS 68 56  BILITOT 1.4* 2.1*  PROT 5.2* 4.9*  ALBUMIN 2.6* 2.5*    Recent Labs Lab 11/21/14 1810  LIPASE 62*    Recent Labs Lab 11/22/14 2110 11/23/14 0510 11/24/14 0531  AMMONIA 148* 107* 86*   CBC:  Recent Labs Lab 11/21/14 1810  11/22/14 0530 11/22/14 1504 11/22/14 2110 11/23/14 0510 11/24/14 0531  WBC 9.9  --  7.0 7.0 6.2 5.9 5.5  NEUTROABS 7.8*  --   --   --   --   --   --   HGB 7.7*  < > 6.5* 7.1*  7.6* 7.0* 7.9*  HCT 23.1*  < > 19.6* 21.9* 23.1* 22.0* 24.1*  MCV 95.9  --  96.1 92.8 94.3 93.6 94.1  PLT 136*  --  97* 109* 127* 108* 115*  < > = values in this interval not displayed. Cardiac Enzymes: No results for input(s): CKTOTAL, CKMB, CKMBINDEX, TROPONINI in the last 168 hours. BNP (last 3 results) No results for input(s): BNP in the last 8760 hours.  ProBNP (last 3 results) No results for input(s): PROBNP in the last 8760 hours.  CBG:  Recent Labs Lab 11/23/14 2052 11/24/14 0005 11/24/14 0356 11/24/14 0749 11/24/14 1155  GLUCAP 171* 152* 162* 133* 162*    Recent Results (from the past 240 hour(s))  MRSA PCR Screening     Status: None   Collection Time: 11/15/14 11:18 AM  Result Value Ref Range Status   MRSA by PCR NEGATIVE NEGATIVE Final    Comment:         The GeneXpert MRSA Assay (FDA approved for NASAL specimens only), is one component of a comprehensive MRSA colonization surveillance program. It is not intended to diagnose MRSA infection nor to guide or monitor treatment for MRSA infections.   MRSA PCR Screening     Status: None   Collection Time: 11/21/14 10:51 PM  Result Value Ref Range Status   MRSA by PCR NEGATIVE NEGATIVE Final    Comment:        The GeneXpert MRSA Assay (FDA approved for NASAL specimens only), is one component of a comprehensive MRSA colonization surveillance program. It is not intended to diagnose MRSA infection nor to guide or monitor treatment for MRSA infections.      Studies: Ct Head Wo Contrast  11/23/2014   CLINICAL DATA:  73 year old female with acute encephalopathy. Elevated ammonia, lethargy. Initial encounter. Current history of cirrhosis.  EXAM: CT HEAD WITHOUT CONTRAST  TECHNIQUE: Contiguous axial images were obtained from the base of the skull through the vertex without intravenous contrast.  COMPARISON:  None.  FINDINGS: Visualized paranasal sinuses and mastoids are clear. No acute osseous abnormality identified. Visualized orbit soft tissues are within normal limits. Visualized scalp soft tissues are within normal limits.  Cerebral volume is within normal limits for age. Calcified atherosclerosis at the skull base. No midline shift, ventriculomegaly, mass effect, evidence of mass lesion, intracranial hemorrhage or evidence of cortically based acute infarction. Gray-white matter differentiation is within normal limits for age throughout the brain. No suspicious intracranial vascular hyperdensity.  IMPRESSION: Normal for age noncontrast CT appearance of the brain.   Electronically Signed   By: Odessa Fleming M.D.   On: 11/23/2014 15:11    Scheduled Meds: . FLUoxetine  20 mg Oral QPM  . insulin aspart  0-9 Units Subcutaneous 6 times per day  . insulin detemir  40 Units Subcutaneous BID  . lactulose   30 g Oral BID  . lactulose  300 mL Rectal TID  . levothyroxine  137 mcg Oral QAC breakfast  . lidocaine  10 mL Infiltration Once  . mupirocin ointment   Nasal BID  . oxymetazoline  1 spray Each Nare BID  . [START ON 11/25/2014] pantoprazole (PROTONIX) IV  40 mg Intravenous Q12H  . sodium chloride  2 spray Each Nare QID   Continuous Infusions: . sodium chloride 50 mL/hr at 11/24/14 0931  . pantoprozole (PROTONIX) infusion 8 mg/hr (11/24/14 1023)    Time Spent: 25 min   FELIZ Rosine Beat  [facility] Pager [phone]. If 7PM-7AM, please contact  night-coverage at [email or web address], password [facility] 11/24/2014, 2:05 PM  LOS: 3 days

## 2014-11-24 NOTE — Progress Notes (Signed)
RT placed pt on auto titrate CPAP 5-20cmH2O on room air via ffm. Pt states she is comfortable and is tolerating CPAP well at this time. RT will continue to monitor as needed.  

## 2014-11-25 DIAGNOSIS — I10 Essential (primary) hypertension: Secondary | ICD-10-CM

## 2014-11-25 LAB — BASIC METABOLIC PANEL
Anion gap: 5 (ref 5–15)
BUN: 12 mg/dL (ref 6–20)
CO2: 25 mmol/L (ref 22–32)
CREATININE: 0.71 mg/dL (ref 0.44–1.00)
Calcium: 8.2 mg/dL — ABNORMAL LOW (ref 8.9–10.3)
Chloride: 111 mmol/L (ref 101–111)
Glucose, Bld: 91 mg/dL (ref 65–99)
POTASSIUM: 3.2 mmol/L — AB (ref 3.5–5.1)
Sodium: 141 mmol/L (ref 135–145)

## 2014-11-25 LAB — CBC
HCT: 23.6 % — ABNORMAL LOW (ref 36.0–46.0)
Hemoglobin: 7.8 g/dL — ABNORMAL LOW (ref 12.0–15.0)
MCH: 30.8 pg (ref 26.0–34.0)
MCHC: 33.1 g/dL (ref 30.0–36.0)
MCV: 93.3 fL (ref 78.0–100.0)
PLATELETS: 108 10*3/uL — AB (ref 150–400)
RBC: 2.53 MIL/uL — AB (ref 3.87–5.11)
RDW: 15.3 % (ref 11.5–15.5)
WBC: 4.9 10*3/uL (ref 4.0–10.5)

## 2014-11-25 LAB — GLUCOSE, CAPILLARY
GLUCOSE-CAPILLARY: 102 mg/dL — AB (ref 65–99)
Glucose-Capillary: 176 mg/dL — ABNORMAL HIGH (ref 65–99)
Glucose-Capillary: 198 mg/dL — ABNORMAL HIGH (ref 65–99)
Glucose-Capillary: 233 mg/dL — ABNORMAL HIGH (ref 65–99)
Glucose-Capillary: 78 mg/dL (ref 65–99)

## 2014-11-25 LAB — AMMONIA
AMMONIA: 61 umol/L — AB (ref 9–35)
Ammonia: 49 umol/L — ABNORMAL HIGH (ref 9–35)

## 2014-11-25 MED ORDER — POTASSIUM CHLORIDE CRYS ER 20 MEQ PO TBCR
40.0000 meq | EXTENDED_RELEASE_TABLET | Freq: Two times a day (BID) | ORAL | Status: DC
  Administered 2014-11-25: 40 meq via ORAL
  Filled 2014-11-25: qty 2

## 2014-11-25 MED ORDER — LACTULOSE 10 GM/15ML PO SOLN: 30.0000 g | Freq: Two times a day (BID) | ORAL | Status: DC

## 2014-11-25 MED ORDER — RIFAXIMIN 550 MG PO TABS: 550.0000 mg | ORAL_TABLET | Freq: Two times a day (BID) | ORAL | Status: AC

## 2014-11-25 NOTE — Discharge Summary (Signed)
Physician Discharge Summary  Martha Wise WNU:[id number] DOB: 01-Nov-1940 DOA: 11/21/2014  PCP: Martha Sauer, MD  Admit date: 11/21/2014 Discharge date: 11/25/2014  Time spent: 35 minutes  Recommendations for Outpatient Follow-up:  1. Repeat CBC in 1 week to monitor platelets and hemoglobin. 2. Martha Wise a skilled nursing facility of NSAIDs.  Discharge Diagnoses:  Principal Problem:   Upper GI bleed Active Problems:   EtOH dependence   Cirrhosis with alcoholism   Acute blood loss anemia   Hypertension   Diabetes   Hypothyroid   Asthma   Thrombocytopenia   Discharge Condition: stable  Diet recommendation: heart healthy  Filed Weights   11/21/14 2200  Weight: 126.9 kg (279 lb 12.2 oz)    History of present illness:  74 y.o. female with a history of Alcoholic Cirrhosis, Alcohol Abuse, HTN DM2, Asthma and Hypothyroid who presents tot he ED with complaints of hematemesis x 1 around 3:30. She relates it was about 2-3 teaspoons. She's also been having epistaxis 2 in the last 48 hours. She had an upper endoscopy and 726 that was negative for any source of bleeding.  Hospital Course:  Acute blood loss anemia due to most likely nosebleeds: - Acute upper GI bleed with recent EGD on 11/16/2011. - S/p 1 unit of PRBC on 8.2.2016. She relates she had epistaxis - ENT bates found streaks of blood left anterior septum extends posteriorly, injected with lidocaine. - Her hemoglobin did remain stable.  Acute hepatic encephalopathy: Her ammonia level was high she was started on lactulose and rifaximin with improvement in her eating.  likely due to bleeding.   Essential Hypertension; No changes made to her medication.  Controlled Diabetes:  no changes made to her medication.  Hypothyroid - cont synthroid.  Thrombocytopenia: - due to liver disease  EtOH dependence/ cirrhosis with  Procedures:  None  Consultations:  GI  ENT  Discharge Exam: Filed Vitals:    11/25/14 0700  BP: 140/45  Pulse: 71  Temp: 98 F (36.7 C)  Resp: 19    General: A&O x3 Cardiovascular: RRR Respiratory: good air movement CTA B/L  Discharge Instructions   Discharge Instructions    Diet - low sodium heart healthy    Complete by:  As directed      Increase activity slowly    Complete by:  As directed           Current Discharge Medication List    START taking these medications   Details  lactulose (CHRONULAC) 10 GM/15ML solution Take 45 mLs (30 g total) by mouth 2 (two) times daily. Qty: 240 mL, Refills: 0    rifaximin (XIFAXAN) 550 MG TABS tablet Take 1 tablet (550 mg total) by mouth 2 (two) times daily. Qty: 60 tablet, Refills: 0      CONTINUE these medications which have NOT CHANGED   Details  B-D ULTRAFINE III SHORT PEN 31G X 8 MM MISC 1 each by Subconjunctival route as directed.  Refills: 0    Carboxymethylcellul-Glycerin (REFRESH OPTIVE) 1-0.9 % GEL Apply 1-2 drops to eye daily as needed (dry eyes).     cholecalciferol (VITAMIN D) 1000 UNITS tablet Take 3,000 Units by mouth every other day.     docusate sodium (COLACE) 100 MG capsule Take 100 mg by mouth every evening.    ferrous sulfate (FERROUSUL) 325 (65 FE) MG tablet Take 1 tablet (325 mg total) by mouth daily with breakfast. Qty: 30 tablet, Refills: 3    FLUoxetine (PROZAC) 20 MG capsule Take  20 mg by mouth every evening.     Fluticasone-Salmeterol (ADVAIR DISKUS) 250-50 MCG/DOSE AEPB take 1 puff by mouth twice a day during the winter    HUMALOG KWIKPEN 100 UNIT/ML KiwkPen Inject 20 Units into the skin 3 (three) times daily.  Refills: 0    LEVEMIR FLEXTOUCH 100 UNIT/ML Pen Inject 80 Units into the skin 2 (two) times daily.  Refills: 0    metFORMIN (GLUCOPHAGE-XR) 500 MG 24 hr tablet Take 1,000 mg by mouth daily with breakfast. 2 tablets in the am    Multiple Vitamins-Minerals (CENTRUM SILVER ADULT 50+ PO) Take 1 tablet by mouth daily.    ONE TOUCH ULTRA TEST test strip 1 each  by Other route as directed.  Refills: 0    SYNTHROID 137 MCG tablet Take 137 mcg by mouth daily before breakfast.  Refills: 0    valsartan (DIOVAN) 320 MG tablet Take 320 mg by mouth every morning.        Allergies  Allergen Reactions  . Phenergan [Promethazine Hcl] Swelling    Lip swelling  . Hydrocodone Other (See Comments)    Gi upset/ abdominal pain  . Glimepiride Anxiety    Caused extreme anxiety     Follow-up Information    Follow up with Martha Reasons, MD In 2 weeks.   Specialty:  Gastroenterology   Why:  hospital follow up   Contact information:   [address] [place] [address] [phone]        The results of significant diagnostics from this hospitalization (including imaging, microbiology, ancillary and laboratory) are listed below for reference.    Significant Diagnostic Studies: Ct Head Wo Contrast  11/23/2014   CLINICAL DATA:  74 year old female with acute encephalopathy. Elevated ammonia, lethargy. Initial encounter. Current history of cirrhosis.  EXAM: CT HEAD WITHOUT CONTRAST  TECHNIQUE: Contiguous axial images were obtained from the base of the skull through the vertex without intravenous contrast.  COMPARISON:  None.  FINDINGS: Visualized paranasal sinuses and mastoids are clear. No acute osseous abnormality identified. Visualized orbit soft tissues are within normal limits. Visualized scalp soft tissues are within normal limits.  Cerebral volume is within normal limits for age. Calcified atherosclerosis at the skull base. No midline shift, ventriculomegaly, mass effect, evidence of mass lesion, intracranial hemorrhage or evidence of cortically based acute infarction. Gray-white matter differentiation is within normal limits for age throughout the brain. No suspicious intracranial vascular hyperdensity.  IMPRESSION: Normal for age noncontrast CT appearance of the brain.   Electronically Signed   By: Martha Wise M.D.   On: 11/23/2014 15:11    Ct Abdomen Pelvis W Contrast  11/21/2014   CLINICAL DATA:  Abdominal pain and GI bleeding. Vomiting blood. Initial encounter.  EXAM: CT ABDOMEN AND PELVIS WITH CONTRAST  TECHNIQUE: Multidetector CT imaging of the abdomen and pelvis was performed using the standard protocol following bolus administration of intravenous contrast.  CONTRAST:  OMNIPAQUE IOHEXOL 300 MG/ML  SOLN  COMPARISON:  01/27/2014.  FINDINGS: Musculoskeletal: No aggressive osseous lesions. Lumbar spondylosis is expected for age. No AVN.  Lung Bases: Atelectasis.  Liver: Hepatic cirrhosis is present. Discrete mass lesion. No change from recent MRI.  Spleen:  Chronic splenomegaly.  Gallbladder:  Within normal limits.  Common bile duct:  Normal.  Pancreas:  Fatty infiltration.  Adrenal glands:  Normal bilaterally.  Kidneys: Normal enhancement and delayed excretion of contrast. LEFT ureter normal. RIGHT ureter normal.  Stomach: Distended with food and fluid. Oral contrast is  present in the antrum.  Small bowel:  No small bowel obstruction or inflammatory changes.  Colon:   Diverticulosis without diverticulitis.  Pelvic Genitourinary: Hysterectomy. Urinary bladder appears normal.  Peritoneum: No ascites.  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 Lymphadenopathy is also present in the upper abdomen secondary to cirrhosis and portal hypertension. The main portal vein is patent. Abdominal aortic atherosclerosis. Esophageal varices are also present although less pronounced and gastric varices.  Body Wall: Infiltration of the subcutaneous fat of the abdominal wall which may represent panniculitis in the appropriate setting. Ventral hernia repair.  IMPRESSION: 1. Hepatic cirrhosis with gastric varices. Esophageal varices are also present. Both of these may represent sources of hematemesis. 2. Portal venous hypertension and splenomegaly.   Electronically Signed   By: Andreas Newport M.D.   On: 11/21/2014 19:48     Microbiology: Recent Results (from the past 240 hour(s))  MRSA PCR Screening     Status: None   Collection Time: 11/15/14 11:18 AM  Result Value Ref Range Status   MRSA by PCR NEGATIVE NEGATIVE Final    Comment:        The GeneXpert MRSA Assay (FDA approved for NASAL specimens only), is one component of a comprehensive MRSA colonization surveillance program. It is not intended to diagnose MRSA infection nor to guide or monitor treatment for MRSA infections.   MRSA PCR Screening     Status: None   Collection Time: 11/21/14 10:51 PM  Result Value Ref Range Status   MRSA by PCR NEGATIVE NEGATIVE Final    Comment:        The GeneXpert MRSA Assay (FDA approved for NASAL specimens only), is one component of a comprehensive MRSA colonization surveillance program. It is not intended to diagnose MRSA infection nor to guide or monitor treatment for MRSA infections.      Labs: Basic Metabolic Panel:  Recent Labs Lab 11/21/14 1810 11/21/14 1819 11/22/14 0530 11/22/14 2110 11/23/14 0510 11/25/14 0506  NA 140 140 139 139 142 141  K 4.0 3.9 4.7 4.2 3.9 3.2*  CL 108 108 109 110 111 111  CO2 22  --  23 24 24 25   GLUCOSE 150* 145* 158* 118* 83 91  BUN 15 13 22* 22* 19 12  CREATININE 0.70 0.70 0.74 0.90 0.86 0.71  CALCIUM 8.8*  --  8.4* 8.3* 8.3* 8.2*   Liver Function Tests:  Recent Labs Lab 11/21/14 1810 11/23/14 0510  AST 78* 83*  ALT 43 41  ALKPHOS 68 56  BILITOT 1.4* 2.1*  PROT 5.2* 4.9*  ALBUMIN 2.6* 2.5*    Recent Labs Lab 11/21/14 1810  LIPASE 62*    Recent Labs Lab 11/22/14 2110 11/23/14 0510 11/24/14 0531 11/25/14 0506  AMMONIA 148* 107* 86* 61*   CBC:  Recent Labs Lab 11/21/14 1810  11/22/14 1504 11/22/14 2110 11/23/14 0510 11/24/14 0531 11/25/14 0506  WBC 9.9  < > 7.0 6.2 5.9 5.5 4.9  NEUTROABS 7.8*  --   --   --   --   --   --   HGB 7.7*  < > 7.1* 7.6* 7.0* 7.9* 7.8*  HCT 23.1*  < > 21.9* 23.1* 22.0* 24.1* 23.6*  MCV 95.9   < > 92.8 94.3 93.6 94.1 93.3  PLT 136*  < > 109* 127* 108* 115* 108*  < > = values in this interval not displayed. Cardiac Enzymes: No results for input(s): CKTOTAL, CKMB, CKMBINDEX, TROPONINI in the last 168 hours. BNP:  BNP (last 3 results) No results for input(s): BNP in the last 8760 hours.  ProBNP (last 3 results) No results for input(s): PROBNP in the last 8760 hours.  CBG:  Recent Labs Lab 11/24/14 1155 11/24/14 1616 11/24/14 1920 11/25/14 0016 11/25/14 0417  GLUCAP 162* 233* 212* 198* 102*       Signed:  Marinda Elk  [facility] 11/25/2014, 7:54 AM

## 2014-11-25 NOTE — Progress Notes (Signed)
Pt placed on Auto CPAP via FFM.  Pt tolerating well at this time, RT to monitor and assess as needed.  

## 2014-11-25 NOTE — Progress Notes (Signed)
Clinical Social Work  CSW spoke with patient and family who chose [facility] for placement. CSW faxed DC summary and SNF agreeable to accept today. CSW prepared DC packet with FL2, DC summary and hard scripts included. Family to transport patient to SNF. RN to call report and provide family with packet.  CSW is signing off but available if needed.  [place], [place] [phone]

## 2014-11-25 NOTE — Clinical Social Work Placement (Signed)
   CLINICAL SOCIAL WORK PLACEMENT  NOTE  Date:  11/25/2014  Patient Details  Name: Martha Wise MRN: [id number] Date of Birth: May 04, 1940  Clinical Social Work is seeking post-discharge placement for this patient at the Skilled  Nursing Facility level of care (*CSW will initial, date and re-position this form in  chart as items are completed):  Yes   Patient/family provided with [facility] Clinical Social Work Department's list of facilities offering this level of care within the geographic area requested by the patient (or if unable, by the patient's family).  Yes   Patient/family informed of their freedom to choose among providers that offer the needed level of care, that participate in Medicare, Medicaid or managed care program needed by the patient, have Wise available bed and are willing to accept the patient.  Yes   Patient/family informed of 's ownership interest in [facility] and [facility], as well as of the fact that they are under no obligation to receive care at these facilities.  PASRR submitted to EDS on 11/24/14     PASRR number received on 11/25/14     Existing PASRR number confirmed on       FL2 transmitted to all facilities in geographic area requested by pt/family on 11/24/14     FL2 transmitted to all facilities within larger geographic area on       Patient informed that his/her managed care company has contracts with or will negotiate with certain facilities, including the following:        Yes   Patient/family informed of bed offers received.  Patient chooses bed at [facility]     Physician recommends and patient chooses bed at      Patient to be transferred to [facility] on 11/25/14.  Patient to be transferred to facility by Family     Patient family notified on 11/25/14 of transfer.  Name of family member notified:  Family-dtr and son at bedside     PHYSICIAN       Additional Comment:     _______________________________________________ Marnee Spring, LCSW 11/25/2014, 3:16 PM

## 2014-11-25 NOTE — Progress Notes (Signed)
Report called to Lawson Fiscal at [facility].

## 2014-11-25 NOTE — Progress Notes (Signed)
Clinical Social Work  Per MD, patient is medically stable to DC. CSW met with patient, dtr and son at bedside to give bed offers. Patient upset that [facility] cannot offer but agreeable to look at other choices. Dtr and son to go and tour facilities and will have decision by 3 pm today. Family has CSW contact information if further questions arise.  [place], [place] [phone]

## 2014-11-25 NOTE — Care Management Note (Signed)
Case Management Note  Patient Details  Name: SLOANE PALMER MRN: [id number] Date of Birth: March 14, 1941  Subjective/Objective:                    Action/Plan: Date:  November 25, 2014 U.R. performed for needs and level of care. Will continue to follow for Case Management needs.  Marcelle Smiling, RN, BSN, [place]   [phone]  Expected Discharge Date:                  Expected Discharge Plan:  Home/Self Care  In-House Referral:  Clinical Social Work  Discharge planning Services  CM Consult  Post Acute Care Choice:  NA Choice offered to:  NA  DME Arranged:  N/A DME Agency:  NA  HH Arranged:  NA HH Agency:  NA  Status of Service:  In process, will continue to follow  Medicare Important Message Given:  Yes-second notification given Date Medicare IM Given:    Medicare IM give by:    Date Additional Medicare IM Given:    Additional Medicare Important Message give by:     If discussed at Long Length of Stay Meetings, dates discussed:    Additional Comments:  Golda Acre, RN 11/25/2014, 11:48 AM

## 2014-11-25 NOTE — Evaluation (Addendum)
Physical Therapy Evaluation Patient Details Name: Martha Wise MRN: [id number] DOB: 1940-07-04 Today's Date: 11/25/2014   History of Present Illness  Martha Wise is a 74 y.o. female with a history of Alcoholic Cirrhosis, Alcohol Abuse, HTN DM2, Asthma and Hypothyroid who presents tothe ED 11/21/14 with complaints of hematemesis x 1 She reports also having peri-umbiical ABD pain and melena  She had an Upper Endoscopy on 07/26 by Dr Randa Evens of GI with negative finding of Bleeding at that time. She was transfused 1 unit of blood and left the hospital with a hemoglobin of 8.0. On admission her initial hemoglobin is 7.7. A CT scan of the ABD was performed and revealed Hepatic Cirrhosis, with Gastric Varices, and evidence of Portal HTN and Splenomegaly. She was placed on an IV Protonix drip and referred for admission  Clinical Impression  Patient is weaker than last admit, requires assist for mobility. Ambulates slowly with RW. Patient will benefit from PT to address problems listed in note below(PT Problem List)    Follow Up Recommendations SNF;Supervision/Assistance - 24 hour    Equipment Recommendations  None recommended by PT    Recommendations for Other Services       Precautions / Restrictions Precautions Precautions: Fall Precaution Comments: incontinence Restrictions Weight Bearing Restrictions: No      Mobility  Bed Mobility Overal bed mobility: Needs Assistance Bed Mobility: Supine to Sit     Supine to sit: Min assist;HOB elevated     General bed mobility comments: support to get trunk to upright  Transfers Overall transfer level: Needs assistance Equipment used: Rolling walker (2 wheeled) Transfers: Sit to/from Stand Sit to Stand: Min assist         General transfer comment: verbal cues for hand placement, extra time to rise    Ambulation/Gait Ambulation/Gait assistance: Min assist Ambulation Distance (Feet): 20 Feet, then80 ' w/ RW, slow  speed Assistive device: Rolling walker (2 wheeled) Gait Pattern/deviations: Step-to pattern;Step-through pattern;Decreased stride length;Wide base of support Gait velocity: slow   General Gait Details: cues for safety, extra time  Stairs            Wheelchair Mobility    Modified Rankin (Stroke Patients Only)       Balance Overall balance assessment: Needs assistance Sitting-balance support: Feet supported;Bilateral upper extremity supported Sitting balance-Leahy Scale: Good     Standing balance support: During functional activity;Single extremity supported Standing balance-Leahy Scale: Fair Standing balance comment: stands and performs self pericare agter toileting with min guard.                             Pertinent Vitals/Pain Pain Assessment: No/denies pain    Home Living Family/patient expects to be discharged to:: Private residence Living Arrangements: Alone Available Help at Discharge: Family;Available PRN/intermittently Type of Home: House Home Access: Stairs to enter     Home Layout: Laundry or work area in basement;Two level;Able to live on main level with bedroom/bathroom Home Equipment: Gilmer Mor - single point;Toilet riser      Prior Function Level of Independence: Needs assistance   Gait / Transfers Assistance Needed: w/ RW since  last week DC.           Hand Dominance        Extremity/Trunk Assessment   Upper Extremity Assessment: Generalized weakness           Lower Extremity Assessment: Generalized weakness         Communication  Cognition Arousal/Alertness: Awake/alert Behavior During Therapy: WFL for tasks assessed/performed Overall Cognitive Status: Within Functional Limits for tasks assessed                      General Comments      Exercises        Assessment/Plan    PT Assessment Patient needs continued PT services  PT Diagnosis Difficulty walking;Generalized weakness   PT Problem  List Decreased strength;Decreased activity tolerance;Decreased mobility;Obesity;Decreased knowledge of use of DME  PT Treatment Interventions DME instruction;Gait training;Functional mobility training;Patient/family education;Therapeutic activities;Therapeutic exercise   PT Goals (Current goals can be found in the Care Plan section) Acute Rehab PT Goals Patient Stated Goal: I need to go to rehab. PT Goal Formulation: With patient/family Time For Goal Achievement: 12/09/14 Potential to Achieve Goals: Good    Frequency Min 3X/week   Barriers to discharge Decreased caregiver support      Co-evaluation               End of Session   Activity Tolerance: Patient tolerated treatment well Patient left: in chair;with call bell/phone within reach;with family/visitor present Nurse Communication: Mobility status         Time: 9604-5409 PT Time Calculation (min) (ACUTE ONLY): 30 min   Charges:   PT Evaluation $Initial PT Evaluation Tier I: 1 Procedure PT Treatments $Gait Training: 8-22 mins   PT G Codes:        Rada Hay 11/25/2014, 9:41 AM Blanchard Kelch PT [phone]

## 2014-12-01 ENCOUNTER — Encounter: Payer: Self-pay | Admitting: Internal Medicine

## 2014-12-01 ENCOUNTER — Non-Acute Institutional Stay (SKILLED_NURSING_FACILITY): Payer: Medicare Other | Admitting: Internal Medicine

## 2014-12-01 DIAGNOSIS — I10 Essential (primary) hypertension: Secondary | ICD-10-CM

## 2014-12-01 DIAGNOSIS — E038 Other specified hypothyroidism: Secondary | ICD-10-CM

## 2014-12-01 DIAGNOSIS — K7682 Hepatic encephalopathy: Secondary | ICD-10-CM

## 2014-12-01 DIAGNOSIS — E034 Atrophy of thyroid (acquired): Secondary | ICD-10-CM | POA: Diagnosis not present

## 2014-12-01 DIAGNOSIS — Z6841 Body Mass Index (BMI) 40.0 and over, adult: Secondary | ICD-10-CM

## 2014-12-01 DIAGNOSIS — K922 Gastrointestinal hemorrhage, unspecified: Secondary | ICD-10-CM | POA: Diagnosis not present

## 2014-12-01 DIAGNOSIS — D696 Thrombocytopenia, unspecified: Secondary | ICD-10-CM | POA: Diagnosis not present

## 2014-12-01 DIAGNOSIS — D62 Acute posthemorrhagic anemia: Secondary | ICD-10-CM

## 2014-12-01 DIAGNOSIS — K7030 Alcoholic cirrhosis of liver without ascites: Secondary | ICD-10-CM

## 2014-12-01 DIAGNOSIS — E118 Type 2 diabetes mellitus with unspecified complications: Secondary | ICD-10-CM

## 2014-12-01 DIAGNOSIS — F1010 Alcohol abuse, uncomplicated: Secondary | ICD-10-CM

## 2014-12-01 DIAGNOSIS — K7290 Hepatic failure, unspecified without coma: Secondary | ICD-10-CM | POA: Diagnosis not present

## 2014-12-01 DIAGNOSIS — G4733 Obstructive sleep apnea (adult) (pediatric): Secondary | ICD-10-CM | POA: Diagnosis not present

## 2014-12-01 NOTE — Assessment & Plan Note (Signed)
Presented as hematemasis, but ultimately felt to be from post nose bleeds, per pt, a long standing problem; makes more sense in setting ETOH abuse

## 2014-12-01 NOTE — Assessment & Plan Note (Addendum)
Found to be hepatic with ammonia 148, tx with lactulose; SNF plan - cont lactulose and repeat ammonia level  Later note - Pt's new ammonia level is 142. Pt has received her lactulose, 30 gm BID but she does not appear encephalopathic today. Plan to continue current lactulose and repeat ammonia in 2 days;monitor mental status.

## 2014-12-01 NOTE — Assessment & Plan Note (Signed)
Pt has been using;it probably does add to nasal bleed problem but it is too important to stop

## 2014-12-01 NOTE — Addendum Note (Signed)
Addended by: Merrilee Seashore D on: 12/01/2014 04:23 PM   Modules accepted: Level of Service

## 2014-12-01 NOTE — Assessment & Plan Note (Signed)
No signs of withdrawal; pt will not be able to drink in SNF

## 2014-12-01 NOTE — Assessment & Plan Note (Signed)
Controlled on valsartan 320 mg daily ;plan - cont same

## 2014-12-01 NOTE — Assessment & Plan Note (Signed)
Diabetic diet which should help with weight loss while at [facility]

## 2014-12-01 NOTE — Progress Notes (Addendum)
MRN: [id number] Name: Martha Wise  Sex: female Age: 74 y.o. DOB: 09/22/1940  [facility] #: [facility] Facility/Room:110 Level Of Care: SNF Provider: Merrilee Seashore D Emergency Contacts: Extended Emergency Contact Information Primary Emergency Contact: Marcy Salvo [place] of [place] Mobile Phone: [phone] Relation: Daughter Secondary Emergency Contact: Bill [place]inas [place] of [place] Mobile Phone: [phone] Relation: Son  Code Status: FULL  Allergies: Phenergan; Hydrocodone; and Glimepiride  Chief Complaint  Patient presents with  . New Admit To SNF    HPI: Patient is 74 y.o. female with a history of Alcoholic Cirrhosis, Alcohol Abuse, HTN DM2, Asthma and Hypothyroid who presents tot he ED with complaints of hematemesis x 1, about 2-3 teaspoons. She had been having epistaxis 2 in the last 48 hours. She had an upper endoscopy and 7/26 that was negative for any source of bleeding. She was admitted to hospital from 7/31 to 8/4 for ABLA felt to be 2/2 to nosebleeds. She also was encephalopathic, found to have high ammonia level and was treated and started on lactulose. Pt is admitted to SNF for generalized weakness in setting of acute anemia for OT/PT. While at SNF pt will be followed for HTN, tx with valsartan, hypothyroid, tx with synthroid and DM2 tx with levemir,  humalog and glucophage.  Past Medical History  Diagnosis Date  . OSA (obstructive sleep apnea)   . Asthma   . Hypertension   . Diabetes   . Allergic rhinitis   . Fibromyalgia   . Alcoholic cirrhosis   . Esophageal varices   . Hypothyroid     Past Surgical History  Procedure Laterality Date  . Abdominal hysterectomy    . Bladder tact    . Esophagogastroduodenoscopy N/A 11/16/2014    Procedure: ESOPHAGOGASTRODUODENOSCOPY (EGD);  Surgeon: Carman Ching, MD;  Location: [facility];  Service: Endoscopy;  Laterality: N/A;  . Gastric varices banding N/A 11/16/2014    Procedure: GASTRIC  VARICES BANDING;  Surgeon: Carman Ching, MD;  Location: [facility];  Service: Endoscopy;  Laterality: N/A;      Medication List       This list is accurate as of: 12/01/14  1:20 PM.  Always use your most recent med list.               ADVAIR DISKUS 250-50 MCG/DOSE Aepb  Generic drug:  Fluticasone-Salmeterol  take 1 puff by mouth twice a day during the winter     B-D ULTRAFINE III SHORT PEN 31G X 8 MM Misc  Generic drug:  Insulin Pen Needle  1 each by Subconjunctival route as directed.     CENTRUM SILVER ADULT 50+ PO  Take 1 tablet by mouth daily.     cholecalciferol 1000 UNITS tablet  Commonly known as:  VITAMIN D  Take 3,000 Units by mouth every other day.     docusate sodium 100 MG capsule  Commonly known as:  COLACE  Take 100 mg by mouth every evening.     ferrous sulfate 325 (65 FE) MG tablet  Commonly known as:  FERROUSUL  Take 1 tablet (325 mg total) by mouth daily with breakfast.     FLUoxetine 20 MG capsule  Commonly known as:  PROZAC  Take 20 mg by mouth every evening.     HUMALOG KWIKPEN 100 UNIT/ML KiwkPen  Generic drug:  insulin lispro  Inject 20 Units into the skin 3 (three) times daily.     lactulose 10 GM/15ML solution  Commonly known as:  CHRONULAC  Take  45 mLs (30 g total) by mouth 2 (two) times daily.     LEVEMIR FLEXTOUCH 100 UNIT/ML Pen  Generic drug:  Insulin Detemir  Inject 80 Units into the skin 2 (two) times daily.     metFORMIN 500 MG 24 hr tablet  Commonly known as:  GLUCOPHAGE-XR  Take 1,000 mg by mouth daily with breakfast. 2 tablets in the am     ONE TOUCH ULTRA TEST test strip  Generic drug:  glucose blood  1 each by Other route as directed.     REFRESH OPTIVE 1-0.9 % Gel  Generic drug:  Carboxymethylcellul-Glycerin  Apply 1-2 drops to eye daily as needed (dry eyes).     rifaximin 550 MG Tabs tablet  Commonly known as:  XIFAXAN  Take 1 tablet (550 mg total) by mouth 2 (two) times daily.     SYNTHROID 137 MCG tablet   Generic drug:  levothyroxine  Take 137 mcg by mouth daily before breakfast.     valsartan 320 MG tablet  Commonly known as:  DIOVAN  Take 320 mg by mouth every morning.        No orders of the defined types were placed in this encounter.    Immunization History  Administered Date(s) Administered  . Influenza Split 01/22/2012, 02/09/2014  . Influenza,inj,Quad PF,36+ Mos 01/27/2013  . Zoster 01/21/2009    Social History  Substance Use Topics  . Smoking status: Never Smoker   . Smokeless tobacco: Never Used  . Alcohol Use: 0.0 oz/week     Comment: drinks wine     Family history is  + HD, [place]  Review of Systems  DATA OBTAINED: from patient, nurse, family  - concern that BS are too low GENERAL:  no fevers, fatigue, appetite changes SKIN: No itching, rash or wounds EYES: No eye pain, redness, discharge EARS: No earache, tinnitus, change in hearing NOSE: No congestion, drainage; small amt bleeding last 2 nights, not much, pt could taste it  MOUTH/THROAT: No mouth or tooth pain, No sore throat RESPIRATORY: No cough, wheezing, SOB CARDIAC: No chest pain, palpitations, lower extremity edema  GI: No abdominal pain, No N/V/D or constipation, No heartburn or reflux  GU: No dysuria, frequency or urgency, or incontinence  MUSCULOSKELETAL: No unrelieved bone/joint pain NEUROLOGIC: No headache, dizziness or focal weakness PSYCHIATRIC: No c/o anxiety or sadness   Filed Vitals:   12/01/14 1236  BP: 138/68  Pulse: 93  Temp: 98.2 F (36.8 C)  Resp: 18    SpO2 Readings from Last 1 Encounters:  11/25/14 99%        Physical Exam  GENERAL APPEARANCE: Alert, conversant, obese WF,  No acute distress.  SKIN: No diaphoresis rash HEAD: Normocephalic, atraumatic  EYES: Conjunctiva/lids clear. Pupils round, reactive. EOMs intact.  EARS: External exam WNL, canals clear. Hearing grossly normal.  NOSE: No deformity or discharge.  MOUTH/THROAT: Lips w/o lesions  RESPIRATORY:  Breathing is even, unlabored. Lung sounds are clear   CARDIOVASCULAR: Heart RRR no murmurs, rubs or gallops. No peripheral edema.   GASTROINTESTINAL: Abdomen is soft, non-tender, not distended w/ normal bowel sounds. GENITOURINARY: Bladder non tender, not distended  MUSCULOSKELETAL: No abnormal joints or musculature NEUROLOGIC:  Cranial nerves 2-12 grossly intact. Moves all extremities  PSYCHIATRIC: Mood and affect appropriate to situation, no behavioral issues  Patient Active Problem List   Diagnosis Date Noted  . Encephalopathy, hepatic 12/01/2014  . GI bleed 11/21/2014  . Hypertension 11/21/2014  . Diabetes 11/21/2014  . Hypothyroid 11/21/2014  .  Asthma 11/21/2014  . Thrombocytopenia 11/21/2014  . Acute blood loss anemia 11/17/2014  . Upper GI bleed 11/15/2014  . Morbid obesity with body mass index of 45.0-49.9 in adult 11/15/2014  . ETOH abuse 11/15/2014  . Cirrhosis with alcoholism 11/15/2014  . Hypovolemic shock 11/15/2014  . OSA (obstructive sleep apnea) 01/31/2012    CBC    Component Value Date/Time   WBC 4.9 11/25/2014 0506   RBC 2.53* 11/25/2014 0506   HGB 7.8* 11/25/2014 0506   HCT 23.6* 11/25/2014 0506   PLT 108* 11/25/2014 0506   MCV 93.3 11/25/2014 0506   LYMPHSABS 1.4 11/21/2014 1810   MONOABS 0.6 11/21/2014 1810   EOSABS 0.1 11/21/2014 1810   BASOSABS 0.1 11/21/2014 1810    CMP     Component Value Date/Time   NA 141 11/25/2014 0506   K 3.2* 11/25/2014 0506   CL 111 11/25/2014 0506   CO2 25 11/25/2014 0506   GLUCOSE 91 11/25/2014 0506   BUN 12 11/25/2014 0506   CREATININE 0.71 11/25/2014 0506   CALCIUM 8.2* 11/25/2014 0506   PROT 4.9* 11/23/2014 0510   ALBUMIN 2.5* 11/23/2014 0510   AST 83* 11/23/2014 0510   ALT 41 11/23/2014 0510   ALKPHOS 56 11/23/2014 0510   BILITOT 2.1* 11/23/2014 0510   GFRNONAA >60 11/25/2014 0506   GFRAA >60 11/25/2014 0506    Lab Results  Component Value Date   HGBA1C 6.3* 11/22/2014     Ct Abdomen Pelvis W  Contrast  11/21/2014   CLINICAL DATA:  Abdominal pain and GI bleeding. Vomiting blood. Initial encounter.  EXAM: CT ABDOMEN AND PELVIS WITH CONTRAST  TECHNIQUE: Multidetector CT imaging of the abdomen and pelvis was performed using the standard protocol following bolus administration of intravenous contrast.  CONTRAST:  OMNIPAQUE IOHEXOL 300 MG/ML  SOLN  COMPARISON:  01/27/2014.  FINDINGS: Musculoskeletal: No aggressive osseous lesions. Lumbar spondylosis is expected for age. No AVN.  Lung Bases: Atelectasis.  Liver: Hepatic cirrhosis is present. Discrete mass lesion. No change from recent MRI.  Spleen:  Chronic splenomegaly.  Gallbladder:  Within normal limits.  Common bile duct:  Normal.  Pancreas:  Fatty infiltration.  Adrenal glands:  Normal bilaterally.  Kidneys: Normal enhancement and delayed excretion of contrast. LEFT ureter normal. RIGHT ureter normal.  Stomach: Distended with food and fluid. Oral contrast is present in the antrum.  Small bowel:  No small bowel obstruction or inflammatory changes.  Colon:   Diverticulosis without diverticulitis.  Pelvic Genitourinary: Hysterectomy. Urinary bladder appears normal.  Peritoneum: No ascites.  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 Lymphadenopathy is also present in the upper abdomen secondary to cirrhosis and portal hypertension. The main portal vein is patent. Abdominal aortic atherosclerosis. Esophageal varices are also present although less pronounced and gastric varices.  Body Wall: Infiltration of the subcutaneous fat of the abdominal wall which may represent panniculitis in the appropriate setting. Ventral hernia repair.  IMPRESSION: 1. Hepatic cirrhosis with gastric varices. Esophageal varices are also present. Both of these may represent sources of hematemesis. 2. Portal venous hypertension and splenomegaly.   Electronically Signed   By: Andreas Newport M.D.   On: 11/21/2014 19:48    Not all labs, radiology  exams or other studies done during hospitalization come through on my EPIC note; however they are reviewed by me.    Assessment and Plan  Acute blood loss anemia Acute upper GI bleed with recent EGD on 11/16/2011; Hb on 8/1 was 6.5 -  S/p 1 unit of PRBC on 8.2.2016. She relates she had epistaxis - ENT found streaks of blood left anterior septum extends posteriorly SNF plan - repeat CBC to show improvement; cont iron supplement  Upper GI bleed Presented as hematemasis, but ultimately felt to be from post nose bleeds, per pt, a long standing problem; makes more sense in setting ETOH abuse  Encephalopathy, hepatic Found to be hepatic with ammonia 148, tx with lactulose; SNF plan - cont lactulose and repeat ammonia level  ETOH abuse No signs of withdrawal; pt will not be able to drink in SNF  Thrombocytopenia 2/2 ETOH abuse; plan CBC to recheck PLT, especially in setting of chronic nose bleeds  Diabetes Family c/o that BS are too low; a review of all the days here showed good control with lowest being 94 and most ranging between 100-200; she may well have a problem at home when she is drinking ETOH and not eating' A1c in hospital was 6.3 so control good; Pt's regular insulin was decreased from 20 U TID to 15 u TID to appease family without harming her good control; pt continues on glucophage as well; BS check ac and q HS  Hypothyroid Continue home dose of synthroid 137 mcg daily  Hypertension Controlled on valsartan 320 mg daily ;plan - cont same  OSA (obstructive sleep apnea) Pt has been using;it probably does add to nasal bleed problem but it is too important to stop  Morbid obesity with body mass index of 45.0-49.9 in adult Diabetic diet which should help with weight loss while at SNF  Time spent with pt > 45 min > 50% of time with patient was spent reviewing records, labs, tests and studies, counseling and developing plan of care  Margit Hanks, MD

## 2014-12-01 NOTE — Assessment & Plan Note (Signed)
Continue home dose of synthroid 137 mcg daily

## 2014-12-01 NOTE — Assessment & Plan Note (Signed)
CT of abd showed hepatic cirrhosis, gastric varices and evidence of portal HTN and spenomegally, so possibilty for future bleeds is great but this episode appears to be from nosebleed.

## 2014-12-01 NOTE — Assessment & Plan Note (Signed)
2/2 ETOH abuse; plan CBC to recheck PLT, especially in setting of chronic nose bleeds

## 2014-12-01 NOTE — Assessment & Plan Note (Addendum)
Family c/o that BS are too low; a review of all the days here showed good control with lowest being 94 and most ranging between 100-200; she may well have a problem at home when she is drinking ETOH and not eating' A1c in hospital was 6.3 so control good; Pt's regular insulin was decreased from 20 U TID to 15 u TID to appease family without harming her good control; pt continues on glucophage as well; BS check ac and q HS

## 2014-12-01 NOTE — Assessment & Plan Note (Signed)
Acute upper GI bleed with recent EGD on 11/16/2011; Hb on 8/1 was 6.5 - S/p 1 unit of PRBC on 8.2.2016. She relates she had epistaxis - ENT found streaks of blood left anterior septum extends posteriorly SNF plan - repeat CBC to show improvement; cont iron supplement

## 2014-12-10 ENCOUNTER — Encounter: Payer: Self-pay | Admitting: Internal Medicine

## 2014-12-10 ENCOUNTER — Non-Acute Institutional Stay (SKILLED_NURSING_FACILITY): Payer: Medicare Other | Admitting: Internal Medicine

## 2014-12-10 DIAGNOSIS — K7290 Hepatic failure, unspecified without coma: Secondary | ICD-10-CM

## 2014-12-10 DIAGNOSIS — K7682 Hepatic encephalopathy: Secondary | ICD-10-CM

## 2014-12-10 DIAGNOSIS — K7030 Alcoholic cirrhosis of liver without ascites: Secondary | ICD-10-CM | POA: Diagnosis not present

## 2014-12-10 NOTE — Assessment & Plan Note (Addendum)
Pt's NH4 level after last visit decreased to 108 with increased lactulose. Ammonia level today is 190, report was called. This was a routine draw for Pt's GI MD. Pt's mental status is not changed from prior.Pt is on lactulose 30 g (45ml) BID. Last few days pt has had one BM each day so I can increase to 30g TID. I'm speculating that 100-200 is not unusual for pt. No ammonia levels available prior to 11/22/2014.

## 2014-12-10 NOTE — Progress Notes (Signed)
MRN: [id number] Name: Martha Wise  Sex: female Age: 74 y.o. DOB: Feb 21, 1941  [facility]: [facility] Facility/Room:110 Level Of Care: SNF Provider: Merrilee Seashore D Emergency Contacts: Extended Emergency Contact Information Primary Emergency Contact: Marcy Salvo [place] of [place] Mobile Phone: [phone] Relation: Daughter Secondary Emergency Contact: Bill Salinas [place] of [place] Mobile Phone: [phone] Relation: Son  Code Status:FULL  Allergies: Phenergan; Hydrocodone; and Glimepiride  Chief Complaint  Patient presents with  . Acute Visit    HPI: Patient is 74 y.o. female who has  a history of Alcoholic Cirrhosis, Alcohol Abuse, HTN DM2, Asthma and Hypothyroid who is being seen for an ammonia level of 190. Pt does not have mental status change,icterus,or other acute sx of cirrhosis.  Past Medical History  Diagnosis Date  . OSA (obstructive sleep apnea)   . Asthma   . Hypertension   . Diabetes   . Allergic rhinitis   . Fibromyalgia   . Alcoholic cirrhosis   . Esophageal varices   . Hypothyroid     Past Surgical History  Procedure Laterality Date  . Abdominal hysterectomy    . Bladder tact    . Esophagogastroduodenoscopy N/A 11/16/2014    Procedure: ESOPHAGOGASTRODUODENOSCOPY (EGD);  Surgeon: Carman Ching, MD;  Location: [facility];  Service: Endoscopy;  Laterality: N/A;  . Gastric varices banding N/A 11/16/2014    Procedure: GASTRIC VARICES BANDING;  Surgeon: Carman Ching, MD;  Location: [facility];  Service: Endoscopy;  Laterality: N/A;      Medication List       This list is accurate as of: 12/10/14  3:35 PM.  Always use your most recent med list.               ADVAIR DISKUS 250-50 MCG/DOSE Aepb  Generic drug:  Fluticasone-Salmeterol  take 1 puff by mouth twice a day during the winter     B-D ULTRAFINE III SHORT PEN 31G X 8 MM Misc  Generic drug:  Insulin Pen Needle  1 each by Subconjunctival route as directed.      CENTRUM SILVER ADULT 50+ PO  Take 1 tablet by mouth daily.     cholecalciferol 1000 UNITS tablet  Commonly known as:  VITAMIN D  Take 3,000 Units by mouth every other day.     docusate sodium 100 MG capsule  Commonly known as:  COLACE  Take 100 mg by mouth every evening.     ferrous sulfate 325 (65 FE) MG tablet  Commonly known as:  FERROUSUL  Take 1 tablet (325 mg total) by mouth daily with breakfast.     FLUoxetine 20 MG capsule  Commonly known as:  PROZAC  Take 20 mg by mouth every evening.     HUMALOG KWIKPEN 100 UNIT/ML KiwkPen  Generic drug:  insulin lispro  Inject 20 Units into the skin 3 (three) times daily.     lactulose 10 GM/15ML solution  Commonly known as:  CHRONULAC  Take 45 mLs (30 g total) by mouth 2 (two) times daily.     LEVEMIR FLEXTOUCH 100 UNIT/ML Pen  Generic drug:  Insulin Detemir  Inject 80 Units into the skin 2 (two) times daily.     metFORMIN 500 MG 24 hr tablet  Commonly known as:  GLUCOPHAGE-XR  Take 1,000 mg by mouth daily with breakfast. 2 tablets in the am     ONE TOUCH ULTRA TEST test strip  Generic drug:  glucose blood  1 each by Other route as directed.  REFRESH OPTIVE 1-0.9 % Gel  Generic drug:  Carboxymethylcellul-Glycerin  Apply 1-2 drops to eye daily as needed (dry eyes).     rifaximin 550 MG Tabs tablet  Commonly known as:  XIFAXAN  Take 1 tablet (550 mg total) by mouth 2 (two) times daily.     SYNTHROID 137 MCG tablet  Generic drug:  levothyroxine  Take 137 mcg by mouth daily before breakfast.     valsartan 320 MG tablet  Commonly known as:  DIOVAN  Take 320 mg by mouth every morning.        No orders of the defined types were placed in this encounter.    Immunization History  Administered Date(s) Administered  . Influenza Split 01/22/2012, 02/09/2014  . Influenza,inj,Quad PF,36+ Mos 01/27/2013  . Zoster 01/21/2009    Social History  Substance Use Topics  . Smoking status: Never Smoker   . Smokeless  tobacco: Never Used  . Alcohol Use: 0.0 oz/week     Comment: drinks wine     Review of Systems  DATA OBTAINED: from patient, nurse- no c/o or concerns GENERAL:  no fevers, fatigue, appetite changes SKIN: No itching, rash HEENT: No complaint RESPIRATORY: No cough, wheezing, SOB CARDIAC: No chest pain, palpitations, lower extremity edema  GI: No abdominal pain, No N/V/D or constipation, No heartburn or reflux  GU: No dysuria, frequency or urgency, or incontinence  MUSCULOSKELETAL: No unrelieved bone/joint pain NEUROLOGIC: No headache, dizziness  PSYCHIATRIC: No overt anxiety or sadness  Filed Vitals:   12/10/14 1421  BP: 100/60  Pulse: 84  Temp: 98 F (36.7 C)  Resp: 18    Physical Exam  GENERAL APPEARANCE: Alert, conversant, No acute distress  SKIN: No diaphoresis rash HEENT: Unremarkable RESPIRATORY: Breathing is even, unlabored. Lung sounds are clear   CARDIOVASCULAR: Heart RRR no murmurs, rubs or gallops. No peripheral edema  GASTROINTESTINAL: Abdomen is soft, non-tender, not distended w/ normal bowel sounds.  GENITOURINARY: Bladder non tender, not distended  MUSCULOSKELETAL: No abnormal joints or musculature NEUROLOGIC: Cranial nerves 2-12 grossly intact. Moves all extremities PSYCHIATRIC: Mood and affect appropriate to situation, no behavioral issues  Patient Active Problem List   Diagnosis Date Noted  . Encephalopathy, hepatic 12/01/2014  . GI bleed 11/21/2014  . Hypertension 11/21/2014  . Diabetes 11/21/2014  . Hypothyroid 11/21/2014  . Asthma 11/21/2014  . Thrombocytopenia 11/21/2014  . Acute blood loss anemia 11/17/2014  . Upper GI bleed 11/15/2014  . Morbid obesity with body mass index of 45.0-49.9 in adult 11/15/2014  . ETOH abuse 11/15/2014  . Cirrhosis with alcoholism 11/15/2014  . Hypovolemic shock 11/15/2014  . OSA (obstructive sleep apnea) 01/31/2012    CBC    Component Value Date/Time   WBC 4.9 11/25/2014 0506   RBC 2.53* 11/25/2014 0506    HGB 7.8* 11/25/2014 0506   HCT 23.6* 11/25/2014 0506   PLT 108* 11/25/2014 0506   MCV 93.3 11/25/2014 0506   LYMPHSABS 1.4 11/21/2014 1810   MONOABS 0.6 11/21/2014 1810   EOSABS 0.1 11/21/2014 1810   BASOSABS 0.1 11/21/2014 1810    CMP     Component Value Date/Time   NA 141 11/25/2014 0506   K 3.2* 11/25/2014 0506   CL 111 11/25/2014 0506   CO2 25 11/25/2014 0506   GLUCOSE 91 11/25/2014 0506   BUN 12 11/25/2014 0506   CREATININE 0.71 11/25/2014 0506   CALCIUM 8.2* 11/25/2014 0506   PROT 4.9* 11/23/2014 0510   ALBUMIN 2.5* 11/23/2014 0510   AST 83* 11/23/2014  0510   ALT 41 11/23/2014 0510   ALKPHOS 56 11/23/2014 0510   BILITOT 2.1* 11/23/2014 0510   GFRNONAA >60 11/25/2014 0506   GFRAA >60 11/25/2014 0506    Assessment and Plan  Encephalopathy, hepatic Pt's NH4 level after last visit decreased to 108 with increased lactulose. Ammonia level today is 190, report was called. This was a routine draw for Pt's GI MD. Pt's mental status is not changed from prior.Pt is on lactulose 30 g (45ml) BID. Last few days pt has had one BM each day so I can increase to 30g TID. I'm speculating that 100-200 is not unusual for pt. No ammonia levels available prior to 11/22/2014.    Margit Hanks, MD

## 2014-12-10 NOTE — Assessment & Plan Note (Signed)
Chronic and evidence of more endstage  disease.

## 2014-12-13 ENCOUNTER — Non-Acute Institutional Stay (SKILLED_NURSING_FACILITY): Payer: Medicare Other | Admitting: Internal Medicine

## 2014-12-13 ENCOUNTER — Encounter: Payer: Self-pay | Admitting: Internal Medicine

## 2014-12-13 DIAGNOSIS — I10 Essential (primary) hypertension: Secondary | ICD-10-CM | POA: Diagnosis not present

## 2014-12-13 DIAGNOSIS — D696 Thrombocytopenia, unspecified: Secondary | ICD-10-CM

## 2014-12-13 DIAGNOSIS — K7030 Alcoholic cirrhosis of liver without ascites: Secondary | ICD-10-CM | POA: Diagnosis not present

## 2014-12-13 DIAGNOSIS — K7290 Hepatic failure, unspecified without coma: Secondary | ICD-10-CM

## 2014-12-13 DIAGNOSIS — E119 Type 2 diabetes mellitus without complications: Secondary | ICD-10-CM

## 2014-12-13 DIAGNOSIS — K7682 Hepatic encephalopathy: Secondary | ICD-10-CM

## 2014-12-14 ENCOUNTER — Telehealth: Payer: Self-pay | Admitting: *Deleted

## 2014-12-14 NOTE — Telephone Encounter (Signed)
Patient daughter called and stated that she needed prior authorization on one of her mother's medications. Patient was discharged  from [facility]. Instructed her to call [facility] or Psychologist, prison and probation services. She stated that she would.

## 2014-12-20 NOTE — Progress Notes (Signed)
Patient ID: Martha Wise, female   DOB: 1941-01-28, 74 y.o.   MRN: [id number] MRN: [id number] Name: Martha Wise  Sex: female Age: 74 y.o. DOB: 1940-05-10  [facility]: [facility] Facility/Room:110 Level Of Care: SNF Provider: Roena Malady Emergency Contacts: Extended Emergency Contact Information Primary Emergency Contact: Marcy Salvo [place] of [place] Mobile Phone: [phone] Relation: Daughter Secondary Emergency Contact: Bill Salinas [place] of [place] Mobile Phone: [phone] Relation: Son  Code Status: FULL  Allergies: Phenergan; Hydrocodone; and Glimepiride  Chief Complaint  Patient presents with  . Discharge Note    HPI: Patient is 74 y.o. female with a history of Alcoholic Cirrhosis, Alcohol Abuse, HTN DM2, Asthma and Hypothyroid who presented tot he ED with complaints of hematemesis x 1, about 2-3 teaspoons.  She had an upper endoscopy and 7/26 that was negative for any source of bleeding. She was admitted to hospital from 7/31 to 8/4 for ABLA felt to be 2/2 to nosebleeds. She also was encephalopathic, found to have high ammonia level and was treated and started on lactulose. Pt was admitted to SNF for generalized weakness in setting of acute anemia for OT/PT. While at SNF pt  followed for HTN, tx with valsartan, hypothyroid, tx with synthroid and DM2 tx with levemir,  humalog and glucophage. She has done quite well is bright alert and appropriate today-her ammonia level did rise to 190 and Dr. Lyn Hollingshead did increase her lactulose up to 3 times a day which appears to have had a beneficial effect-labs were drawn last week and is my understanding GI is following this with her history of encephalopathy  Past Medical History  Diagnosis Date  . OSA (obstructive sleep apnea)   . Asthma   . Hypertension   . Diabetes   . Allergic rhinitis   . Fibromyalgia   . Alcoholic cirrhosis   . Esophageal varices   . Hypothyroid     Past Surgical  History  Procedure Laterality Date  . Abdominal hysterectomy    . Bladder tact    . Esophagogastroduodenoscopy N/A 11/16/2014    Procedure: ESOPHAGOGASTRODUODENOSCOPY (EGD);  Surgeon: Carman Ching, MD;  Location: [facility];  Service: Endoscopy;  Laterality: N/A;  . Gastric varices banding N/A 11/16/2014    Procedure: GASTRIC VARICES BANDING;  Surgeon: Carman Ching, MD;  Location: [facility];  Service: Endoscopy;  Laterality: N/A;      Medication List       This list is accurate as of: 12/13/14 11:59 PM.  Always use your most recent med list.               ADVAIR DISKUS 250-50 MCG/DOSE Aepb  Generic drug:  Fluticasone-Salmeterol  take 1 puff by mouth twice a day during the winter     B-D ULTRAFINE III SHORT PEN 31G X 8 MM Misc  Generic drug:  Insulin Pen Needle  1 each by Subconjunctival route as directed.     CENTRUM SILVER ADULT 50+ PO  Take 1 tablet by mouth daily.     cholecalciferol 1000 UNITS tablet  Commonly known as:  VITAMIN D  Take 3,000 Units by mouth every other day.     docusate sodium 100 MG capsule  Commonly known as:  COLACE  Take 100 mg by mouth every evening.     ferrous sulfate 325 (65 FE) MG tablet  Commonly known as:  FERROUSUL  Take 1 tablet (325 mg total) by mouth daily with breakfast.     FLUoxetine 20 MG  capsule  Commonly known as:  PROZAC  Take 20 mg by mouth every evening.     HUMALOG KWIKPEN 100 UNIT/ML KiwkPen  Generic drug:  insulin lispro  Inject 15 Units into the skin 3 (three) times daily.     lactulose 10 GM/15ML solution  Commonly known as:  CHRONULAC  Take 45 mLs (30 g total) by mouth 2 (two) times daily.     LEVEMIR FLEXTOUCH 100 UNIT/ML Pen  Generic drug:  Insulin Detemir  Inject 80 Units into the skin 2 (two) times daily.     metFORMIN 500 MG 24 hr tablet  Commonly known as:  GLUCOPHAGE-XR  Take 1,000 mg by mouth daily with breakfast. 2 tablets in the am     ONE TOUCH ULTRA TEST test strip  Generic drug:   glucose blood  1 each by Other route as directed.     REFRESH OPTIVE 1-0.9 % Gel  Generic drug:  Carboxymethylcellul-Glycerin  Apply 1-2 drops to eye daily as needed (dry eyes).     rifaximin 550 MG Tabs tablet  Commonly known as:  XIFAXAN  Take 1 tablet (550 mg total) by mouth 2 (two) times daily.     SYNTHROID 137 MCG tablet  Generic drug:  levothyroxine  Take 137 mcg by mouth daily before breakfast.     valsartan 320 MG tablet  Commonly known as:  DIOVAN  Take 320 mg by mouth every morning.       of note lactulose has been increased to 30 mg 3 times a day  No orders of the defined types were placed in this encounter.    Immunization History  Administered Date(s) Administered  . Influenza Split 01/22/2012, 02/09/2014  . Influenza,inj,Quad PF,36+ Mos 01/27/2013  . Zoster 01/21/2009    Social History  Substance Use Topics  . Smoking status: Never Smoker   . Smokeless tobacco: Never Used  . Alcohol Use: 0.0 oz/week     Comment: drinks wine     Family history is  + HD, CA  Review of Systems  DATA OBTAINED: from patient, nurse, family  - GENERAL:  no fevers, fatigue, appetite changes SKIN: No itching, rash or wounds EYES: No eye pain, redness, discharge EARS: No earache, tinnitus, change in hearing NOSE: No congestion, drainage; recent nasal bleeding apparently this was an issue earlier in her stay  MOUTH/THROAT: No mouth or tooth pain, No sore throat RESPIRATORY: No cough, wheezing, SOB CARDIAC: No chest pain, palpitations, lower extremity edema  GI: No abdominal pain, No N/V/D or constipation, No heartburn or reflux  GU: No dysuria, frequency or urgency, or incontinence  MUSCULOSKELETAL: No unrelieved bone/joint pain NEUROLOGIC: No headache, dizziness or focal weakness PSYCHIATRIC: No c/o anxiety or sadness   Filed Vitals:   12/13/14 2134  BP: 118/56  Pulse: 80  Temp: 98.5 F (36.9 C)  Resp: 20    SpO2 Readings from Last 1 Encounters:  11/25/14 99%         Physical Exam  GENERAL APPEARANCE: Alert, conversant, obese WF,  No acute distress.  SKIN: No diaphoresis rash HEAD: Normocephalic, atraumatic  EYES: Conjunctiva/lids clear. Pupils round, reactive. EOMs intact.  EARS: External exam WNL, canals clear. Hearing grossly normal.  NOSE: No deformity or discharge.  MOUTH/THROAT: Lips w/o lesions  RESPIRATORY: Breathing is even, unlabored. Lung sounds are clear   CARDIOVASCULAR: Heart RRR no murmurs, rubs or gallops. No peripheral edema.   GASTROINTESTINAL: Abdomen is soft, non-tender, not distended w/ normal bowel sounds. GENITOURINARY: Bladder non  tender, not distended  MUSCULOSKELETAL: No abnormal joints or musculature NEUROLOGIC:  Cranial nerves 2-12 grossly intact. Moves all extremities  PSYCHIATRIC: Mood and affect appropriate to situation, no behavioral issues pleasant talkative and appropriate looking forward to going home  Patient Active Problem List   Diagnosis Date Noted  . Encephalopathy, hepatic 12/01/2014  . GI bleed 11/21/2014  . Hypertension 11/21/2014  . Diabetes 11/21/2014  . Hypothyroid 11/21/2014  . Asthma 11/21/2014  . Thrombocytopenia 11/21/2014  . Acute blood loss anemia 11/17/2014  . Upper GI bleed 11/15/2014  . Morbid obesity with body mass index of 45.0-49.9 in adult 11/15/2014  . ETOH abuse 11/15/2014  . Cirrhosis with alcoholism 11/15/2014  . Hypovolemic shock 11/15/2014  . OSA (obstructive sleep apnea) 01/31/2012    CBC    Component Value Date/Time   WBC 4.9 11/25/2014 0506   RBC 2.53* 11/25/2014 0506   HGB 7.8* 11/25/2014 0506   HCT 23.6* 11/25/2014 0506   PLT 108* 11/25/2014 0506   MCV 93.3 11/25/2014 0506   LYMPHSABS 1.4 11/21/2014 1810   MONOABS 0.6 11/21/2014 1810   EOSABS 0.1 11/21/2014 1810   BASOSABS 0.1 11/21/2014 1810    CMP     Component Value Date/Time   NA 141 11/25/2014 0506   K 3.2* 11/25/2014 0506   CL 111 11/25/2014 0506   CO2 25 11/25/2014 0506   GLUCOSE 91  11/25/2014 0506   BUN 12 11/25/2014 0506   CREATININE 0.71 11/25/2014 0506   CALCIUM 8.2* 11/25/2014 0506   PROT 4.9* 11/23/2014 0510   ALBUMIN 2.5* 11/23/2014 0510   AST 83* 11/23/2014 0510   ALT 41 11/23/2014 0510   ALKPHOS 56 11/23/2014 0510   BILITOT 2.1* 11/23/2014 0510   GFRNONAA >60 11/25/2014 0506   GFRAA >60 11/25/2014 0506    Lab Results  Component Value Date   HGBA1C 6.3* 11/22/2014     Ct Abdomen Pelvis W Contrast  11/21/2014   CLINICAL DATA:  Abdominal pain and GI bleeding. Vomiting blood. Initial encounter.  EXAM: CT ABDOMEN AND PELVIS WITH CONTRAST  TECHNIQUE: Multidetector CT imaging of the abdomen and pelvis was performed using the standard protocol following bolus administration of intravenous contrast.  CONTRAST:  OMNIPAQUE IOHEXOL 300 MG/ML  SOLN  COMPARISON:  01/27/2014.  FINDINGS: Musculoskeletal: No aggressive osseous lesions. Lumbar spondylosis is expected for age. No AVN.  Lung Bases: Atelectasis.  Liver: Hepatic cirrhosis is present. Discrete mass lesion. No change from recent MRI.  Spleen:  Chronic splenomegaly.  Gallbladder:  Within normal limits.  Common bile duct:  Normal.  Pancreas:  Fatty infiltration.  Adrenal glands:  Normal bilaterally.  Kidneys: Normal enhancement and delayed excretion of contrast. LEFT ureter normal. RIGHT ureter normal.  Stomach: Distended with food and fluid. Oral contrast is present in the antrum.  Small bowel:  No small bowel obstruction or inflammatory changes.  Colon:   Diverticulosis without diverticulitis.  Pelvic Genitourinary: Hysterectomy. Urinary bladder appears normal.  Peritoneum: No ascites.  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 Lymphadenopathy is also present in the upper abdomen secondary to cirrhosis and portal hypertension. The main portal vein is patent. Abdominal aortic atherosclerosis. Esophageal varices are also present although less pronounced and gastric varices.  Body Wall:  Infiltration of the subcutaneous fat of the abdominal wall which may represent panniculitis in the appropriate setting. Ventral hernia repair.  IMPRESSION: 1. Hepatic cirrhosis with gastric varices. Esophageal varices are also present. Both of these may represent sources of hematemesis.  2. Portal venous hypertension and splenomegaly.   Electronically Signed   By: Andreas Newport M.D.   On: 11/21/2014 19:48    Not all labs, radiology exams or other studies done during hospitalization come through on my EPIC note; however they are reviewed by me.    Assessment and Plan  Acute blood loss anemia-she did receive a transfusion in the hospital-she has had no further episodes of epistaxis. She is on iron see her hemoglobin was 7.8 on August 4 updated lab has been done  Updated labs have been ordered will write an order to make sure these labs were drawn and sent to the appropriate follow-up physician.  Hepatic encephalopathy-again ammonia level did rise to 190 lactulose was increased in this appears to have improved although I do not see the updated ammonia level again this has been ordered and apparently obtained-clinically she appears to be doing quite well.  Thrombocytopenia again updated labs have been drawn as noted above platelet count was 108,000 on August 4--at that time her hemoglobin was 7.8.  Diabetes-patient came there was some concern by family her blood sugars were too low hemoglobin A1c was 6.3 in the hospital-her regular insulin was decreased from 20 units 3 times a day to 15 units 3 times a day-she also is on Glucophage--as well as Levemir 80 units twice a day wi-blood sugars in the morning appear to be mid 100s to mid 200s generally this appears to be the case at noon as well-at 4 PM blood sugars more in the 100s range with occasional readings above 200-at bedtime readings also appear to be more in the mid 100s to 100 range-at this point since patient is going home and CBGs appear  relatively well-controlled will not change medication.  Hypothyroidism-she is on supplementation Will need follow-up by primary care provider.  Hypertension she is on valsartan recent blood pressures 118/56 129/70 highest one that I see is 146/67 at this point generally blood pressures appear to be susceptible.  Patient will need continued PT and OT for strengthening she has again done quite well she is ambulatory-she will need continued PT and OT for further strengthening again with her history of encephalopathy weakness and diabetes-also home health support follow-up of multiple medical issues as noted.  Patient is quite independent appears to be doing well will have to make sure CBC CMP and ammonia level have been obtained and faxed to the appropriate physician.  [id number] note greater than 30 minutes spent preparing this discharge summary-greater than 50% of time spent reviewing chart discussing patient's status with family and nursing staff-and coordinating plan of care   Khalia Gong C,

## 2015-01-04 ENCOUNTER — Ambulatory Visit: Payer: Medicare Other | Admitting: Podiatry

## 2015-01-17 ENCOUNTER — Ambulatory Visit (INDEPENDENT_AMBULATORY_CARE_PROVIDER_SITE_OTHER): Payer: Medicare Other | Admitting: Podiatry

## 2015-01-17 DIAGNOSIS — M79676 Pain in unspecified toe(s): Secondary | ICD-10-CM

## 2015-01-17 DIAGNOSIS — B351 Tinea unguium: Secondary | ICD-10-CM

## 2015-01-17 NOTE — Progress Notes (Signed)
Patient ID: Martha Wise, female   DOB: 21-Jan-1941, 74 y.o.   MRN: [id number]  Subjective: 74 y.o. returns the office today for painful, elongated, thickened toenails which she is unable to trim herself. Denies any redness or drainage around the nails. She was recently hospitalized for bleeding which was found to be from here nose. Denies any acute changes since last appointment and no new complaints today. Denies any systemic complaints such as fevers, chills, nausea, vomiting.   Objective: AAO 3, NAD Protective sensation intact with Simms Weinstein monofilament Nails hypertrophic, dystrophic, elongated, brittle, discolored 10. There is tenderness overlying the nails 10. There is no surrounding erythema or drainage along the nail sites. No open lesions or pre-ulcerative lesions are identified. No other areas of tenderness bilateral lower extremities. No overlying edema, erythema, increased warmth. No pain with calf compression, swelling, warmth, erythema.  Assessment: Patient presents with symptomatic onychomycosis  Plan: -Treatment options including alternatives, risks, complications were discussed -Nails sharply debrided 10 without complication/bleeding. -Discussed daily foot inspection. If there are any changes, to call the office immediately.  -Follow-up in 3 months or sooner if any problems are to arise. In the meantime, encouraged to call the office with any questions, concerns, changes symptoms.  Ovid Curd, DPM

## 2015-01-25 ENCOUNTER — Emergency Department (HOSPITAL_COMMUNITY)
Admission: EM | Admit: 2015-01-25 | Discharge: 2015-01-25 | Disposition: A | Discharge status: Home / Self Care | Payer: Medicare Other | Attending: Emergency Medicine | Admitting: Emergency Medicine

## 2015-01-25 ENCOUNTER — Encounter (HOSPITAL_COMMUNITY): Payer: Self-pay | Admitting: Family Medicine

## 2015-01-25 ENCOUNTER — Ambulatory Visit (HOSPITAL_COMMUNITY): Payer: Medicare Other

## 2015-01-25 DIAGNOSIS — E119 Type 2 diabetes mellitus without complications: Secondary | ICD-10-CM | POA: Insufficient documentation

## 2015-01-25 DIAGNOSIS — Z7951 Long term (current) use of inhaled steroids: Secondary | ICD-10-CM | POA: Diagnosis not present

## 2015-01-25 DIAGNOSIS — I10 Essential (primary) hypertension: Secondary | ICD-10-CM | POA: Insufficient documentation

## 2015-01-25 DIAGNOSIS — Z792 Long term (current) use of antibiotics: Secondary | ICD-10-CM | POA: Insufficient documentation

## 2015-01-25 DIAGNOSIS — R197 Diarrhea, unspecified: Secondary | ICD-10-CM | POA: Diagnosis not present

## 2015-01-25 DIAGNOSIS — R112 Nausea with vomiting, unspecified: Secondary | ICD-10-CM

## 2015-01-25 DIAGNOSIS — J45909 Unspecified asthma, uncomplicated: Secondary | ICD-10-CM | POA: Insufficient documentation

## 2015-01-25 DIAGNOSIS — R531 Weakness: Secondary | ICD-10-CM

## 2015-01-25 DIAGNOSIS — Z79899 Other long term (current) drug therapy: Secondary | ICD-10-CM | POA: Diagnosis not present

## 2015-01-25 DIAGNOSIS — E039 Hypothyroidism, unspecified: Secondary | ICD-10-CM | POA: Insufficient documentation

## 2015-01-25 DIAGNOSIS — R1084 Generalized abdominal pain: Secondary | ICD-10-CM | POA: Diagnosis present

## 2015-01-25 DIAGNOSIS — Z90710 Acquired absence of both cervix and uterus: Secondary | ICD-10-CM | POA: Insufficient documentation

## 2015-01-25 DIAGNOSIS — Z8669 Personal history of other diseases of the nervous system and sense organs: Secondary | ICD-10-CM | POA: Insufficient documentation

## 2015-01-25 LAB — COMPREHENSIVE METABOLIC PANEL
ALBUMIN: 2.7 g/dL — AB (ref 3.5–5.0)
ALK PHOS: 70 U/L (ref 38–126)
ALT: 23 U/L (ref 14–54)
ANION GAP: 13 (ref 5–15)
AST: 57 U/L — ABNORMAL HIGH (ref 15–41)
BUN: 37 mg/dL — ABNORMAL HIGH (ref 6–20)
CALCIUM: 9.5 mg/dL (ref 8.9–10.3)
CO2: 20 mmol/L — ABNORMAL LOW (ref 22–32)
Chloride: 103 mmol/L (ref 101–111)
Creatinine, Ser: 0.85 mg/dL (ref 0.44–1.00)
GFR calc Af Amer: >60 mL/min (ref >60)
GLUCOSE: 261 mg/dL — AB (ref 65–99)
Potassium: 4.3 mmol/L (ref 3.5–5.1)
Sodium: 136 mmol/L (ref 135–145)
Total Bilirubin: 1.4 mg/dL — ABNORMAL HIGH (ref 0.3–1.2)
Total Protein: 5.7 g/dL — ABNORMAL LOW (ref 6.5–8.1)

## 2015-01-25 LAB — CBC WITH DIFFERENTIAL/PLATELET
BASOS ABS: 0.1 10*3/uL (ref 0.0–0.1)
BASOS PCT: 1 %
EOS ABS: 0.1 10*3/uL (ref 0.0–0.7)
Eosinophils Relative: 1 %
HCT: 28.9 % — ABNORMAL LOW (ref 36.0–46.0)
Hemoglobin: 9.3 g/dL — ABNORMAL LOW (ref 12.0–15.0)
Lymphocytes Relative: 16 %
Lymphs Abs: 1.4 10*3/uL (ref 0.7–4.0)
MCH: 28.4 pg (ref 26.0–34.0)
MCHC: 32.2 g/dL (ref 30.0–36.0)
MCV: 88.1 fL (ref 78.0–100.0)
MONO ABS: 0.4 10*3/uL (ref 0.1–1.0)
MONOS PCT: 5 %
Neutro Abs: 6.8 10*3/uL (ref 1.7–7.7)
Neutrophils Relative %: 77 %
Platelets: 121 10*3/uL — ABNORMAL LOW (ref 150–400)
RBC: 3.28 MIL/uL — ABNORMAL LOW (ref 3.87–5.11)
RDW: 14.8 % (ref 11.5–15.5)
WBC: 8.8 10*3/uL (ref 4.0–10.5)

## 2015-01-25 LAB — I-STAT CG4 LACTIC ACID, ED
LACTIC ACID, VENOUS: 6.45 mmol/L — AB (ref 0.5–2.0)
Lactic Acid, Venous: 4.85 mmol/L (ref 0.5–2.0)

## 2015-01-25 LAB — LIPASE, BLOOD: LIPASE: 27 U/L (ref 22–51)

## 2015-01-25 LAB — AMMONIA: AMMONIA: 36 umol/L — AB (ref 9–35)

## 2015-01-25 MED ORDER — SODIUM CHLORIDE 0.9 % IV BOLUS (SEPSIS)
1000.0000 mL | Freq: Once | INTRAVENOUS | Status: AC
  Administered 2015-01-25: 1000 mL via INTRAVENOUS

## 2015-01-25 MED ORDER — GI COCKTAIL ~~LOC~~
30.0000 mL | Freq: Once | ORAL | Status: AC
  Administered 2015-01-25: 30 mL via ORAL
  Filled 2015-01-25: qty 30

## 2015-01-25 MED ORDER — LACTULOSE 10 GM/15ML PO SOLN: 20.0000 g | Freq: Two times a day (BID) | ORAL | Status: AC

## 2015-01-25 MED ORDER — SODIUM CHLORIDE 0.9 % IV SOLN
INTRAVENOUS | Status: DC
  Administered 2015-01-25: 11:00:00 via INTRAVENOUS

## 2015-01-25 MED ORDER — IOHEXOL 300 MG/ML  SOLN
50.0000 mL | Freq: Once | INTRAMUSCULAR | Status: DC | PRN
  Administered 2015-01-25 (×2): 50 mL via ORAL
  Filled 2015-01-25 (×2): qty 50

## 2015-01-25 MED ORDER — IOHEXOL 300 MG/ML  SOLN
100.0000 mL | Freq: Once | INTRAMUSCULAR | Status: AC | PRN
  Administered 2015-01-25: 100 mL via INTRAVENOUS

## 2015-01-25 MED ORDER — OMEPRAZOLE 20 MG PO CPDR: 20.0000 mg | DELAYED_RELEASE_CAPSULE | Freq: Every day | ORAL | Status: AC

## 2015-01-25 NOTE — ED Notes (Addendum)
Dr. Jeraldine Loots and nurse was made aware of pt. elevated lactic acid.

## 2015-01-25 NOTE — ED Provider Notes (Signed)
CSN: [id number]     Arrival date & time 01/25/15  1191 History   First MD Initiated Contact with Patient 01/25/15 [phone]     Chief Complaint  Patient presents with  . Abdominal Pain     (Consider location/radiation/quality/duration/timing/severity/associated sxs/prior Treatment) HPI Patient presents with concern of abdominal pain, nausea, vomiting, diarrhea. Patient recalls onset of symptoms approximately 30 hours ago. Since onset symptoms of been persistent, crampy diffuse abdominal discomfort, nausea, vomiting, diarrhea.  No lightheadedness, syncope, no bright red blood per rectum No chest pain, dyspnea. No relief with anything. Patient has a notable history of recent hospitalization, was at a rehabilitation facility until recently discharged home. She states that she is doing generally well and no longer drinks alcohol, though she acknowledges a long history of alcohol dependency.  Past Medical History  Diagnosis Date  . OSA (obstructive sleep apnea)   . Asthma   . Hypertension   . Diabetes (HCC)   . Allergic rhinitis   . Fibromyalgia   . Alcoholic cirrhosis (HCC)   . Esophageal varices (HCC)   . Hypothyroid    Past Surgical History  Procedure Laterality Date  . Abdominal hysterectomy    . Bladder tact    . Esophagogastroduodenoscopy N/A 11/16/2014    Procedure: ESOPHAGOGASTRODUODENOSCOPY (EGD);  Surgeon: Carman Ching, MD;  Location: [facility];  Service: Endoscopy;  Laterality: N/A;  . Gastric varices banding N/A 11/16/2014    Procedure: GASTRIC VARICES BANDING;  Surgeon: Carman Ching, MD;  Location: [facility];  Service: Endoscopy;  Laterality: N/A;   Family History  Problem Relation Age of Onset  . Allergies Mother   . Asthma Mother     children   . Heart disease      father side of family  . Cancer Mother    Social History  Substance Use Topics  . Smoking status: Never Smoker   . Smokeless tobacco: Never Used  . Alcohol Use: No     Comment: Last drink:  Middle of July. Denies drinking due to history.    OB History    No data available     Review of Systems  Constitutional:       Per HPI, otherwise negative  HENT:       Per HPI, otherwise negative  Respiratory:       Per HPI, otherwise negative  Cardiovascular:       Per HPI, otherwise negative  Gastrointestinal: Positive for nausea, vomiting, abdominal pain and diarrhea.  Endocrine:       Negative aside from HPI  Genitourinary:       Neg aside from HPI   Musculoskeletal:       Per HPI, otherwise negative  Skin: Negative.   Neurological: Negative for syncope.      Allergies  Phenergan; Hydrocodone; and Glimepiride  Home Medications   Prior to Admission medications   Medication Sig Start Date End Date Taking? Authorizing Provider  B-D ULTRAFINE III SHORT PEN 31G X 8 MM MISC 1 each by Subconjunctival route as directed.  06/22/14  Yes Historical Provider, MD  ONE TOUCH ULTRA TEST test strip 1 each by Other route as directed.  06/16/14  Yes Historical Provider, MD  Carboxymethylcellul-Glycerin (REFRESH OPTIVE) 1-0.9 % GEL Apply 1-2 drops to eye daily as needed (dry eyes).     Historical Provider, MD  cholecalciferol (VITAMIN D) 1000 UNITS tablet Take 3,000 Units by mouth every other day.     Historical Provider, MD  docusate sodium (COLACE) 100 MG  capsule Take 100 mg by mouth every evening.    Historical Provider, MD  ferrous sulfate (FERROUSUL) 325 (65 FE) MG tablet Take 1 tablet (325 mg total) by mouth daily with breakfast. 11/17/14   Calvert Cantor, MD  FLUoxetine (PROZAC) 20 MG capsule Take 20 mg by mouth every evening.     Historical Provider, MD  Fluticasone-Salmeterol (ADVAIR DISKUS) 250-50 MCG/DOSE AEPB take 1 puff by mouth twice a day during the winter 04/17/13   Melissa Smith, PA-C  HUMALOG KWIKPEN 100 UNIT/ML KiwkPen Inject 15 Units into the skin 3 (three) times daily.  11/02/14   Historical Provider, MD  lactulose (CHRONULAC) 10 GM/15ML solution Take 45 mLs (30 g total)  by mouth 2 (two) times daily. Patient taking differently: Take 30 g by mouth 3 (three) times daily.  11/25/14   Marinda Elk, MD  LEVEMIR FLEXTOUCH 100 UNIT/ML Pen Inject 80 Units into the skin 2 (two) times daily.  11/03/14   Historical Provider, MD  metFORMIN (GLUCOPHAGE-XR) 500 MG 24 hr tablet Take 1,000 mg by mouth daily with breakfast. 2 tablets in the am 01/17/12   Historical Provider, MD  Multiple Vitamins-Minerals (CENTRUM SILVER ADULT 50+ PO) Take 1 tablet by mouth daily.    Historical Provider, MD  rifaximin (XIFAXAN) 550 MG TABS tablet Take 1 tablet (550 mg total) by mouth 2 (two) times daily. 11/25/14   Marinda Elk, MD  SYNTHROID 137 MCG tablet Take 137 mcg by mouth daily before breakfast.  06/15/14   Historical Provider, MD  valsartan (DIOVAN) 320 MG tablet Take 320 mg by mouth every morning.     Historical Provider, MD   BP 133/41 mmHg  Pulse 106  Temp(Src) 98.3 F (36.8 C) (Oral)  Resp 20  Ht 5\' 5"  (1.651 m)  Wt 279 lb (126.554 kg)  BMI 46.43 kg/m2  SpO2 98% Physical Exam  Constitutional: She appears well-developed and well-nourished. No distress.  Morbidly obese F resting in bed, NAD  HENT:  Head: Normocephalic and atraumatic.  Eyes: Conjunctivae and EOM are normal.  Cardiovascular: Normal rate and regular rhythm.   Pulmonary/Chest: Effort normal and breath sounds normal. No stridor. No respiratory distress.  Abdominal: She exhibits no distension and no mass. There is no tenderness. There is no rebound and no guarding.  Musculoskeletal: She exhibits no edema.  Neurological: She is alert. No cranial nerve deficit.  No tremor, no asterixis  Skin: Skin is warm and dry.  Psychiatric: She has a normal mood and affect.  Nursing note and vitals reviewed.   ED Course  Procedures (including critical care time) Labs Review Labs Reviewed  COMPREHENSIVE METABOLIC PANEL - Abnormal; Notable for the following:    CO2 20 (*)    Glucose, Bld 261 (*)    BUN 37 (*)     Total Protein 5.7 (*)    Albumin 2.7 (*)    AST 57 (*)    Total Bilirubin 1.4 (*)    All other components within normal limits  CBC WITH DIFFERENTIAL/PLATELET - Abnormal; Notable for the following:    RBC 3.28 (*)    Hemoglobin 9.3 (*)    HCT 28.9 (*)    Platelets 121 (*)    All other components within normal limits  I-STAT CG4 LACTIC ACID, ED - Abnormal; Notable for the following:    Lactic Acid, Venous 6.45 (*)    All other components within normal limits  LIPASE, BLOOD    I have personally reviewed and evaluated these lab  results as part of my medical decision-making.   Chart review demonstrates recent hospitalization with abdominal pain.  IMPRESSION: 1. Hepatic cirrhosis with gastric varices. Esophageal varices are also present. Both of these may represent sources of hematemesis. 2. Portal venous hypertension and splenomegaly.   Electronically Signed   By: Andreas Newport M.D.   On: 11/21/2014 19:48    Initial lactic acid 6.45 NS running.   9:19 AM Patient states that her abdominal discomfort has resolved. VSS  Patient states that she has mild abdominal pain, and given her description of substantial nausea, vomiting, opacities, CT scan will be performed.   Update: Repeat lactic 33% decreased, fluids continue to be provided  Update: Patient stated that she is beginning to feel better  IVF running.   Update: I reviewed the CT imaging myself, agree with the interpretation. Subsequently I discussed the patient's CT scan with her, her sister, and via telephone the patient's daughter, who is a Engineer, civil (consulting). The patient herself is now ambulatory, smiling, interacting in a normal manner, with no evidence for encephalopathy.   I discussed inpatient monitoring versus outpatient follow-up, sooner with her gastroenterologist. Patient stated that she would like to go home, follow-up as an outpatient, states that she feels she will be okay at home, safe. The patient's sister was with  [place] during this conversation.   MDM   Final diagnoses:  Non-intractable vomiting with nausea, vomiting of unspecified type  Weakness   This patient with a history of alcohol abuse, cirrhosis, recent hospitalization now presents with concern over one episode of nausea, vomiting, diffuse abdominal discomfort. Here the patient is mildly tachycardic initially, but afebrile, with a non-peritoneal abdomen. Given episodic abdominal pain, CT scan was performed in addition to labs. Patient had initial lactic acidosis consistent with dehydration, as it improved with fluid resuscitation. No fever, no leukocytosis argues against ongoing intra-abdominal infection. Patient does have CT evidence consistent with prior findings of cirrhosis, varices. The patient has a mild lactic acidosis, this improvement, as well as her clinical improvement, to baseline ambulatory capacity, appropriate mental status, no ongoing complaints are all very reassuring. Patient does have known intra-abdominal pathology, and will follow up with her gastroenterologist. Given concern for the dehydration, possible colonic irritation from lactulose, dosage was decreased, and the patient started on a PPI as well.   Gerhard Munch, MD 01/25/15 [phone]

## 2015-01-25 NOTE — ED Notes (Signed)
Bed: ZO10 Expected date:  Expected time:  Means of arrival:  Comments: EMS 74 yo female from home, abdominal pain, dark stools

## 2015-01-25 NOTE — ED Notes (Signed)
Pt requesting assistance to BR.

## 2015-01-25 NOTE — ED Notes (Signed)
Patient transported to CT 

## 2015-01-25 NOTE — ED Notes (Signed)
Pt placed on monitor.  

## 2015-01-25 NOTE — Discharge Instructions (Signed)
As discussed, though you have improved substantially, distal important that you follow-up with your gastroenterologist within the next few days to ensure appropriate ongoing management of your disease.  Return here for concerning changes.  Please be sure to take all medication as directed.  Specifically, please be sure to take the changed dose of your lactulose, and your newly prescribed omeprazole.  Discuss these additions with your gastroenterologist.

## 2015-01-25 NOTE — ED Notes (Signed)
Pt is from home and was transported by [facility]. Pt made a roughage soup on Sunday. Pt is complaining of generalized, aching abd pain that started early Monday morning. Also, has had diarrhea but made herself vomit around 3:30 this morning. Emesis was bright red.

## 2015-01-25 NOTE — ED Notes (Signed)
Pt informed daughter will be calling and verbally gave permission to speak to daughter.  Daughter now on line and requesting EDP call with results; she is also requesting if EDP will draw ammonia.  Informed daughter will inform EDP of request.  Daughter provided her Prentice Docker) # [phone] and her brother, Lloyd Huger, [phone].

## 2015-02-16 ENCOUNTER — Ambulatory Visit: Payer: Medicare Other | Admitting: Pulmonary Disease

## 2015-03-25 ENCOUNTER — Ambulatory Visit: Payer: Medicare Other | Admitting: Internal Medicine

## 2015-05-02 ENCOUNTER — Ambulatory Visit: Payer: Medicare Other | Admitting: Podiatry

## 2015-05-13 ENCOUNTER — Ambulatory Visit: Payer: Medicare Other | Admitting: Pulmonary Disease

## 2017-03-05 ENCOUNTER — Ambulatory Visit: Admit: 2017-03-05 | Discharge: 2017-03-05 | Attending: Hematology & Oncology | Primary: Internal Medicine

## 2017-03-05 DIAGNOSIS — R945 Abnormal results of liver function studies: Secondary | ICD-10-CM

## 2017-03-05 DIAGNOSIS — Z794 Long term (current) use of insulin: Secondary | ICD-10-CM

## 2017-03-05 DIAGNOSIS — D731 Hypersplenism: Secondary | ICD-10-CM

## 2017-03-05 DIAGNOSIS — K7030 Alcoholic cirrhosis of liver without ascites: Secondary | ICD-10-CM

## 2017-03-05 DIAGNOSIS — E611 Iron deficiency: Secondary | ICD-10-CM

## 2017-03-05 DIAGNOSIS — D649 Anemia, unspecified: Principal | ICD-10-CM

## 2017-03-05 DIAGNOSIS — I10 Essential (primary) hypertension: Secondary | ICD-10-CM

## 2017-03-05 DIAGNOSIS — K766 Portal hypertension: Secondary | ICD-10-CM

## 2017-03-05 DIAGNOSIS — J449 Chronic obstructive pulmonary disease, unspecified: Secondary | ICD-10-CM

## 2017-03-05 DIAGNOSIS — E119 Type 2 diabetes mellitus without complications: Secondary | ICD-10-CM

## 2017-03-05 DIAGNOSIS — E039 Hypothyroidism, unspecified: Secondary | ICD-10-CM

## 2017-03-05 NOTE — Progress Notes
Charmayne SheerHenry Dewitt Rodeffer, MD  [address]  [address]  [place] [id number]    I was asked by my colleague in internal medicine to see this patient in consultation regarding her anemia for which she was recently hospitalized and received blood transfusions.    Patient Name: Angel SimmondsChristina Tietje       Date Of Birth: 01/29/1941    History of Present Illness:     Date of initial consultation: 03/05/2017    The patient is a very pleasant 76 year old Caucasian relatively overweight female patient, who was referred to this clinic for workup and evaluation of anemia, for which the patient was recently hospitalized and she had to have 2 units of packed red blood cell transfusion. This happen again 2 years ago in the same hospital. On 02/12/2017 her CBC showed a hemoglobin of 8.2, hematocrit 24%, WBC 6.51, platelet count 158 and MCV of 77. The patient is visiting today in this clinic with her daughter who is a nurse at her local hospital.  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 She has been sober though for the last 2-1/2 years but she still continues to have liver issues. She has been followed by gastroenterology and she had an upper endoscopy in May 2018 which revealed esophageal varices but no upper GI bleeding. When she had a blood transfusion 2 years ago her hemoglobin was as low as 6.8 according to the daughter. During her last hospital admission she received limited intravenous iron. The level will be checked was today's visit. The patient and daughter are not aware of any positive serology for hepatitis viruses in the past.    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 The patient also was found to have a right lower lobe

## 2017-03-05 NOTE — Progress Notes
Neck: Normal range of motion. Neck supple.   Cardiovascular: Normal rate, regular rhythm and normal heart sounds.   Pulmonary/Chest: Effort normal and breath sounds normal.   Abdominal: Bowel sounds are normal. There is no tenderness. There is no rebound and no guarding.   Musculoskeletal: Normal range of motion.  She exhibits edema of the lower extremities, chronic appearing with skin discoloration secondary to chronic venous stasis,  Ther eis no deformity.   Neurological: She is alert and oriented to person, place, and time. No cranial nerve deficit.   Skin: Skin is warm and dry.  She has multiple spider like telangiectasias in the upper torso and the the upper extremities secondary to her liver insufficiency.  There is no other systemic rash.  There are several bruises in the extremities.   Psychiatric: She has a normal mood and affect.  Her behavior is normal. Judgment and thought content seem normal.      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 The patient was found during her last cost admission to have iron deficiency and she received intravenous iron during that admission. Her iron profile and ferritin will be checked with his clinic visit along with an anemia panel. I will check also in this lady a hepatitis serology as this is the first time I saw her in my clinic and this needs to be documented also. Her cirrhosis has been reported to be due to previous alcohol abuse, but the patient says that she has not tried any alcohol for the last 2.5 years. She is known to have portal hypertension with esophageal varices that have never bled according

## 2017-03-05 NOTE — Progress Notes
nodule in a CT scan of the abdomen and this will be followed with serial CT scans as per her primary team. The patient is extremely weak and she uses a rolling walker to ambulate since October 2018 and previously she used to walk with a cane. She is currently a physical therapy.    Previous surgeries are consistent with umbilical hernia repair, bladder tuck, hysterectomy but not oophorectomy, appendectomy and also cautery of her nose because of epistaxis.    The patient has never been a smoker but she used to abuse alcohol until 2-1/2 years ago. She drank mostly Chardonnay wine.    Regarding her family history her mother died from lung cancer and she was a heavy smoker and she had COPD. Her father died from myocardial infarction at the age of 76. The patient has 1 sister in good health and 3 children in good health as well as grandchildren in good health.    Past Surgical History: Please see above.    Social History:    Social History     Social History   ? Marital status: Unknown     Spouse name: N/A   ? Number of children: N/A   ? Years of education: N/A     Occupational History   ? Not on file.     Social History Main Topics   ? Smoking status: Not on file   ? Smokeless tobacco: Not on file   ? Alcohol use Not on file   ? Drug use: Unknown   ? Sexual activity: Not on file     Other Topics Concern   ? Not on file     Social History Narrative   ? No narrative on file       Family History:  Please see above.    Vital Signs:   Vitals:    03/05/17 0947   BP: 112/76   Pulse: 76   Resp: 18   Temp: 37 ?C (98.6 ?F)   Weight: 129.3 kg (285 lb)   Height: 0.67 m (2' 2.38")       Allergies: Patient has no allergy information on record.    Review of Systems:    This was performed today on 03/05/2017:      Constitutional    The patient does not report any fevers, chills, weight loss, she has malaise/fatigue, no diaphoresis, she has generalized weakness.  She uses a rolling walker to ambulate.  PS is 2.    Skin

## 2017-03-05 NOTE — Progress Notes
There is no systemic rash but easy bruising.  There is no pruritus.    HEENT    There is no hearing loss.  No tinnitus.  There is no ear pain, ear drainage, epistaxis, congestion, sinus pain, stridor, sore throat.    Eyes    There is no blurred vision, double vision, photophobia, eye pain, eye discharge, eye redness.    Cardiovascular    There is no chest pain, palpitations, orthopnea, claudication, leg swelling, PND.    Respiratory     There is no cough, hemoptysis, sputum production, shortness of breath, wheezing.    GI    There is no heartburn, nausea, vomiting, abdominal pain, diarrhea, constipation, blood in the stool, melena.    GU     There is no dysuria, urgency, frequency, hematuria, flank pain.    Musculoskeletal    There is no myalgia, neck pain, back pain, joint pain, falls.    Endocrine    There is no polyuria or polydipsia, intolerance to that heat or cold.    Hematology    There is no easy bruising or bleeding.  There is no epistaxis.    Neurological    The patient does not have dizziness, headaches, tingling, tremors, sensory changes, speech change, focal weakness, seizures, recent loss of consciousness.    Psychiatry    Patient did not complain of depression, suicidal ideation, substance abuse, hallucinations, nervousness/anxiety, insomnia, memory loss.        Medications: No current outpatient prescriptions on file.      Labs:   No visits with results within 2 Month(s) from this visit.   Latest known visit with results is:   No results found for any previous visit.         Imaging Results:       Physical Exam:     03/05/2017:    Constitutional: She is oriented to person, place, and time. She appears well-developed and well-nourished.  She is morbidly obese.   HENT:   Head: Normocephalic and atraumatic.   Mouth/Throat: Oropharynx is clear and moist.   Eyes: Conjunctivae are pale w/o major jaundice and EOM are normal. Pupils are equal, round, and reactive to light.

## 2017-03-05 NOTE — Progress Notes
to her daughter. I will check also for the completeness of the record ANA, reticulocyte count, repeat sed rate, CRP.    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 The patient feels tired most probably secondary to her anemia. She could have also iron deficiency and if this is the case I will arrange for her with the next clinic visit to receive intravenous iron as she did when she was recently in the inpatient setting.    I will see this patient again in follow-up in one week to go over the results of the above-mentioned tests and recommend further evaluation and treatment of this patient.    I appreciate Dr. Lorella Nimrododeffer for this interesting consultation and the results of my evaluation in my opinion will be communicated to him ASAPand in due time.      Recommendation/Plan:   Return to clinic, Test ordered, Review/Obtain old records, Diagnosis and Care Plan discussed, Prognostic/predictive markers reviewed, All questions answered and Counseling/Coordination of Care    Return to Clinic:  in 1 week.       This document was created using voice recognition software. Inadvertent typographical errors, word omissions and/or word substitutions may exist.

## 2017-03-12 ENCOUNTER — Ambulatory Visit: Admit: 2017-03-12 | Discharge: 2017-03-12 | Attending: Hematology & Oncology | Primary: Internal Medicine

## 2017-03-12 DIAGNOSIS — D649 Anemia, unspecified: Principal | ICD-10-CM

## 2017-03-12 DIAGNOSIS — E119 Type 2 diabetes mellitus without complications: Secondary | ICD-10-CM

## 2017-03-12 DIAGNOSIS — K7030 Alcoholic cirrhosis of liver without ascites: Secondary | ICD-10-CM

## 2017-03-12 DIAGNOSIS — D500 Iron deficiency anemia secondary to blood loss (chronic): Secondary | ICD-10-CM

## 2017-03-12 DIAGNOSIS — E611 Iron deficiency: Secondary | ICD-10-CM

## 2017-03-12 DIAGNOSIS — J449 Chronic obstructive pulmonary disease, unspecified: Secondary | ICD-10-CM

## 2017-03-12 DIAGNOSIS — D759 Disease of blood and blood-forming organs, unspecified: Secondary | ICD-10-CM

## 2017-03-12 DIAGNOSIS — F17210 Nicotine dependence, cigarettes, uncomplicated: Secondary | ICD-10-CM

## 2017-03-12 DIAGNOSIS — Z7951 Long term (current) use of inhaled steroids: Secondary | ICD-10-CM

## 2017-03-12 DIAGNOSIS — F1010 Alcohol abuse, uncomplicated: Secondary | ICD-10-CM

## 2017-03-12 DIAGNOSIS — D731 Hypersplenism: Secondary | ICD-10-CM

## 2017-03-12 DIAGNOSIS — I10 Essential (primary) hypertension: Secondary | ICD-10-CM

## 2017-03-12 DIAGNOSIS — E039 Hypothyroidism, unspecified: Secondary | ICD-10-CM

## 2017-03-12 DIAGNOSIS — K766 Portal hypertension: Secondary | ICD-10-CM

## 2017-03-12 MED ORDER — HUMALOG KWIKPEN 100 UNIT/ML SC SOPN
11 refills
Start: 2017-03-12 — End: ?

## 2017-03-12 MED ORDER — PROAIR HFA 108 (90 BASE) MCG/ACT IN AERS
0 refills
Start: 2017-03-12 — End: ?

## 2017-03-12 MED ORDER — PANTOPRAZOLE SODIUM 40 MG PO TBEC
4 refills
Start: 2017-03-12 — End: ?

## 2017-03-12 MED ORDER — ADVAIR HFA 115-21 MCG/ACT IN AERO
2 | Freq: Two times a day (BID) | RESPIRATORY_TRACT | 0 refills
Start: 2017-03-12 — End: ?

## 2017-03-12 MED ORDER — LEVOTHYROXINE SODIUM 100 MCG PO TABS
Freq: Every morning | 10 refills
Start: 2017-03-12 — End: 2017-06-12

## 2017-03-12 MED ORDER — TOUJEO SOLOSTAR 300 UNIT/ML SC SOPN
4 refills
Start: 2017-03-12 — End: ?

## 2017-03-12 MED ORDER — BD PEN NEEDLE SHORT U/F 31G X 8 MM MISC
11 refills
Start: 2017-03-12 — End: ?

## 2017-03-12 MED ORDER — BENZONATATE 100 MG PO CAPS
0 refills
Start: 2017-03-12 — End: 2017-06-12

## 2017-03-12 MED ORDER — TORSEMIDE 20 MG PO TABS
Freq: Every morning | ORAL | 4 refills
Start: 2017-03-12 — End: ?

## 2017-03-12 MED ORDER — DEXAMETHASONE SODIUM PHOSPHATE 20 MG/5ML IJ SOLN
20 mg | INTRAVENOUS | Status: CN | PRN
Start: 2017-03-12 — End: ?

## 2017-03-12 MED ORDER — DIPHENHYDRAMINE HCL 50 MG/ML IJ SOLN
25 mg | INTRAVENOUS | Status: CN | PRN
Start: 2017-03-12 — End: ?

## 2017-03-12 MED ORDER — FERRIC CARBOXYMALTOSE IVPB
750 mg | Freq: Once | INTRAVENOUS | Status: CN
Start: 2017-03-12 — End: ?

## 2017-03-12 MED ORDER — LEVOTHYROXINE SODIUM 125 MCG PO TABS
Freq: Every morning | 10 refills
Start: 2017-03-12 — End: ?

## 2017-03-12 MED ORDER — PREDNISONE 20 MG PO TABS
Freq: Every day | ORAL | 0 refills
Start: 2017-03-12 — End: 2017-06-12

## 2017-03-12 MED ORDER — SPIRONOLACTONE 100 MG PO TABS
3 refills
Start: 2017-03-12 — End: ?

## 2017-03-12 MED ORDER — HEPARIN SODIUM LOCK FLUSH 100 UNIT/ML IV SOLN
500 [IU] | Status: CN | PRN
Start: 2017-03-12 — End: ?

## 2017-03-12 MED ORDER — TRUEPLUS LANCETS 30G MISC
3 refills
Start: 2017-03-12 — End: ?

## 2017-03-12 MED ORDER — MEPERIDINE HCL 25 MG/ML IJ SOLN
25 mg | INTRAVENOUS | Status: CN | PRN
Start: 2017-03-12 — End: ?

## 2017-03-12 MED ORDER — LACTULOSE 10 GM/15ML PO SOLN
5 refills
Start: 2017-03-12 — End: ?

## 2017-03-12 MED ORDER — ROPINIROLE HCL 0.25 MG PO TABS
0 refills
Start: 2017-03-12 — End: 2017-06-12

## 2017-03-12 MED ORDER — ROSUVASTATIN CALCIUM 5 MG PO TABS
3 refills
Start: 2017-03-12 — End: ?

## 2017-03-12 MED ORDER — CLOTRIMAZOLE 10 MG MT LOZG: Start: 2017-03-12 — End: 2017-06-12

## 2017-03-12 MED ORDER — FLUOXETINE HCL 20 MG PO CAPS
Freq: Two times a day (BID) | 3 refills
Start: 2017-03-12 — End: ?

## 2017-03-12 MED ORDER — SODIUM CHLORIDE 0.9 % IV SOLN
Freq: Once | INTRAVENOUS | Status: CN
Start: 2017-03-12 — End: ?

## 2017-03-12 MED ORDER — HYDROCORTISONE NA SUCCINATE PF 100 MG IJ SOLR
100 mg | INTRAVENOUS | Status: CN | PRN
Start: 2017-03-12 — End: ?

## 2017-03-12 MED ORDER — SODIUM CHLORIDE FLUSH 0.9 % IV SOLN
10 mL | Status: CN | PRN
Start: 2017-03-12 — End: ?

## 2017-03-12 MED ORDER — XIFAXAN 550 MG PO TABS
11 refills
Start: 2017-03-12 — End: ?

## 2017-03-12 MED ORDER — TRAMADOL HCL 50 MG PO TABS
0 refills
Start: 2017-03-12 — End: 2017-06-12

## 2017-03-12 MED ORDER — VALSARTAN 160 MG PO TABS
Freq: Every morning | 0 refills
Start: 2017-03-12 — End: ?

## 2017-03-12 MED ORDER — ONETOUCH ULTRA 2 W/DEVICE KIT
0 refills
Start: 2017-03-12 — End: ?

## 2017-03-12 MED ORDER — ROPINIROLE HCL 0.5 MG PO TABS: Start: 2017-03-12 — End: 2017-06-12

## 2017-03-12 MED ORDER — EPINEPHRINE 0.3 MG/0.3ML IJ SOAJ
0.3 mg | INTRAMUSCULAR | Status: CN | PRN
Start: 2017-03-12 — End: ?

## 2017-03-12 NOTE — Progress Notes
hypersplenism as well as decreased liver function with decreased production of thrombopoietin and other growth factors that can affect marrow function. The patient felt tired most probably secondary to her anemia but her H&H has improved and she feels better post Hospital d/c. Iron will be administered tentatively on 11/28 and 03/27/2017, with [employer] auth.      I spent at least 40 min with the patinet and her daughter reviewing and explaining lab results, treatent plan, side effects and follow up in this clinic with most of the time counseling the patient in the bove issues.  She will not need oral iron.    I will see this patient again in follow-up in 3 months with repeat labs prior as below.    I appreciate Dr. Lorella Nimrododeffer for this interesting consultation and the results of my evaluation, follow up and my opinion will be communicated to him ASAPand in due time in the future.      Recommendation/Plan:   Return to clinic, Test ordered, Review/Obtain old records, Diagnosis and Care Plan discussed, Prognostic/predictive markers reviewed, All questions answered and Counseling/Coordination of Care    Return to Clinic:  in 3 months with labs prior to include CBC, CMP, LDH, iron profile, ferritin, reticulocyte count, STRA, ANA repeat was reflex.       This document was created using voice recognition software. Inadvertent typographical errors, word omissions and/or word substitutions may exist.

## 2017-03-12 NOTE — Progress Notes
Disp: , Rfl: 0  ?  B-D ULTRAFINE III SHORT PEN 31G X 8 MM XX, USE SEVEN TIMES WITH INSULIN., Disp: , Rfl: 11  ?  benzonatate (TESSALON) 100 MG PO Capsule, TK 1 C PO TID FOR 10 DAYS PRN FOR COUGH, Disp: , Rfl: 0  ?  Clotrimazole (MYCELEX) 10 MG MT Lozenge, , Disp: , Rfl:   ?  FLUoxetine (PROzac) 20 MG PO Capsule, 2 times daily., Disp: , Rfl: 3  ?  HumaLOG KwikPen, INJ 20 UNITS SC WITH EACH MEAL, Disp: , Rfl: 11  ?  lactulose 10 GM/15ML PO Solution, TK 15 ML PO BID, Disp: , Rfl: 5  ?  levothyroxine (SYNTHROID, LEVOTHROID) 100 MCG PO Tablet, every morning at 8 AM., Disp: , Rfl: 10  ?  levothyroxine (SYNTHROID, LEVOTHROID) 125 MCG PO Tablet, every morning at 8 AM., Disp: , Rfl: 10  ?  ONE TOUCH ULTRA 2 w/Device XX Kit, U UTD, Disp: , Rfl: 0  ?  pantoprazole (PROTONIX) 40 MG PO Tablet Delayed Release, TK 1 T PO QD, Disp: , Rfl: 4  ?  predniSONE (DELTASONE) 20 MG PO Tablet, Take by mouth daily., Disp: , Rfl: 0  ?  PROAIR HFA 108 (90 Base) MCG/ACT IN Aerosol Solution, INHALE 2 PUFFS Q 4 H PRN FOR WHEEZING, Disp: , Rfl: 0  ?  rOPINIRole (REQUIP) 0.25 MG PO Tablet, TK 1 T PO HS FOR 30 DAYS PRN, Disp: , Rfl: 0  ?  rOPINIRole (REQUIP) 0.5 MG PO Tablet, , Disp: , Rfl:   ?  rosuvastatin (CRESTOR) 5 MG PO Tablet, TK 1 T PO Q MWF HS, Disp: , Rfl: 3  ?  spironolactone (ALDACTONE) 100 MG PO Tablet, TK 1 T PO QD, Disp: , Rfl: 3  ?  torsemide (DEMADEX) 20 MG PO Tablet, Take by mouth every morning at 8 AM., Disp: , Rfl: 4  ?  TOUJEO SOLOSTAR ** CONCENTRATED ** injection 300 units/mL pen, INJECT 100 UNITS SQ BID, Disp: , Rfl: 4  ?  traMADol (ULTRAM) 50 MG PO Tablet, TK 1 T PO Q 4 H PRN FOR MODERATE PAIN, Disp: , Rfl: 0  ?  TRUEPLUS LANCETS 30G XX Miscellaneous, USE TO TEST BID, Disp: , Rfl: 3  ?  valsartan (DIOVAN) 160 MG PO Tablet, every morning at 8 AM., Disp: , Rfl: 0  ?  XIFAXAN 550 MG PO Tablet, TK 1 T PO BID, Disp: , Rfl: 11      Labs:   Office Visit on 03/05/2017   Component Date Value   ? TIBC 03/05/2017 448

## 2017-03-12 NOTE — Progress Notes
?   Hep B C IgM 03/05/2017 Negative    ? HEP C VIRUS AB 03/05/2017 0.1    ? Sed Rate 03/05/2017 80*   ? ANTINUCLEAR ANTIBODIES, * 03/05/2017 Positive*   ? WBC 03/05/2017 5.28    ? RBC 03/05/2017 3.71    ? Hemoglobin 03/05/2017 9.6*   ? Hematocrit 03/05/2017 31.1*   ? MCV 03/05/2017 83.8    ? [facility]    ? MCHC 03/05/2017 30.9    ? RDW 03/05/2017 19.9    ? Platelet Count 03/05/2017 101*   ? MPV 03/05/2017 10.6    ? Neutrophils Absolute 03/05/2017 3.73    ? Neutrophils % 03/05/2017 70.6    ? Lymphocytes Absolute 03/05/2017 0.98    ? Lymphocytes % 03/05/2017 18.6*   ? Monocytes Absolute 03/05/2017 0.32    ? Monocytes % 03/05/2017 6.1    ? Eosinophils Absolute 03/05/2017 0.20    ? Eosinophils % 03/05/2017 3.8    ? Basophil Absolute 03/05/2017 0.05    ? BASOPHILS 03/05/2017 0.9    ? ANA PATTERN 03/05/2017 1:80    ? Note: 03/05/2017 Comment          Imaging Results:       Physical Exam:     03/12/2017:    Constitutional: She is oriented to person, place, and time. She appears well-developed and well-nourished.  She is morbidly obese.   HENT:   Head: Normocephalic and atraumatic.   Mouth/Throat: Oropharynx is clear and moist.   Eyes: Conjunctivae are pale w/o major jaundice and EOM are normal. Pupils are equal, round, and reactive to light.   Neck: Normal range of motion. Neck supple.   Cardiovascular: Normal rate, regular rhythm and normal heart sounds.   Pulmonary/Chest: Effort normal and breath sounds normal.   Abdominal: Bowel sounds are normal. There is no tenderness. There is no rebound and no guarding.   Musculoskeletal: Normal range of motion.  She exhibits edema of the lower extremities, chronic appearing with skin discoloration secondary to chronic venous stasis,  Ther eis no deformity.   Neurological: She is alert and oriented to person, place, and time. No cranial nerve deficit.   Skin: Skin is warm and dry.  She has multiple spider like telangiectasias

## 2017-03-12 NOTE — Progress Notes
in the upper torso and the the upper extremities secondary to her liver insufficiency.  There is no other systemic rash.  There are several bruises in the extremities.   Psychiatric: She has a normal mood and affect.  Her behavior is normal. Judgment and thought content seem normal.      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   I have prescribed IV iron to be given in the clinic with the patient's and daughter's agreement . The patient was found during her last Hospital admission to have iron deficiency and she received intravenous iron during that admission, but w/o completion. Her iron profile and ferritin were checked with last clinic visit along with an anemia panel. I checked also in this lady a hepatitis serology, which was negative as this was the first time I saw her in my clinic and this needed to be documented also. Her cirrhosis has been reported to be due to previous alcohol abuse, but the patient says that she has not tried any alcohol for the last 2.5 years. She is known to have portal hypertension with esophageal varices that have never bled according to her daughter. I checked also for the completeness of the record ANA, which was positive at low titer and this will be repeated with her next blood draw in 3 months, reticulocyte count, repeat sed rate, CRP, which were elevated indicating inflammatory syndrome.    I discussed with the patient and her daughter again causes of cytopenias related to liver cirrhosis and liver insufficiency pertaining to

## 2017-03-12 NOTE — Progress Notes
Charmayne SheerHenry Dewitt Rodeffer, MD  [address]  [address]  [place], [place] [id number]    The patient is being followed in this clinic for her anemia, for which she was previously hospitalized and received blood transfusions.    Patient Name: Angel SimmondsChristina Murphy       Date Of Birth: 03/18/1941    History of Present Illness:     Date of initial consultation: 03/05/2017    The patient is a very pleasant 76 year old Caucasian relatively overweight female patient, who was referred to this clinic for workup and evaluation of anemia, for which the patient was recently hospitalized and she had to have 2 units of packed red blood cell transfusion. This happen again 2 years ago in the same hospital. On 02/12/2017 her CBC showed a hemoglobin of 8.2, hematocrit 24%, WBC 6.51, platelet count 158 and MCV of 77. The patient is visiting today in this clinic with her daughter who is a nurse at our local hospital.  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 She has been sober though for the last 2-1/2 years but she still continues to have liver issues. She has been followed by gastroenterology and she had an upper endoscopy in May 2018 which revealed esophageal varices but no upper GI bleeding. When she had a blood transfusion 2 years ago her hemoglobin was as low as 6.8 according to the daughter. During her last hospital admission she received limited intravenous iron. The level will be checked was today's visit. The patient and daughter are not aware of any positive serology for hepatitis viruses in the past.    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 The patient also was found to have a right lower lobe nodule in a CT scan of the abdomen and this will be followed with serial

## 2017-03-12 NOTE — Progress Notes
Family History:  Please see above.    Vital Signs:   Vitals:    03/12/17 0921   BP: 120/55   Pulse: 73   Resp: 18   Temp: 36.3 ?C (97.4 ?F)   Weight: 127.9 kg (282 lb)   Height: 1.676 m (5\' 6" )       Allergies: Patient has no known allergies.    Review of Systems:    This was performed today on 03/12/2017:      Constitutional    The patient does not report any fevers, chills, weight loss, she has malaise/fatigue, no diaphoresis, she has generalized weakness.  She uses a rolling walker to ambulate.  PS is 2.    Skin    There is no systemic rash but easy bruising.  There is no pruritus.    HEENT    There is no hearing loss.  No tinnitus.  There is no ear pain, ear drainage, epistaxis, congestion, sinus pain, stridor, sore throat.    Eyes    There is no blurred vision, double vision, photophobia, eye pain, eye discharge, eye redness.    Cardiovascular    There is no chest pain, palpitations, orthopnea, claudication, leg swelling, PND.    Respiratory     There is no cough, hemoptysis, sputum production, shortness of breath, wheezing.    GI    There is no heartburn, nausea, vomiting, abdominal pain, diarrhea, constipation, blood in the stool, melena.    GU     There is no dysuria, urgency, frequency, hematuria, flank pain.    Musculoskeletal    There is no myalgia, neck pain, back pain, joint pain, falls.    Endocrine    There is no polyuria or polydipsia, intolerance to that heat or cold.    Hematology    There is no easy bruising or bleeding.  There is no epistaxis.    Neurological    The patient does not have dizziness, headaches, tingling, tremors, sensory changes, speech change, focal weakness, seizures, recent loss of consciousness.    Psychiatry    Patient did not complain of depression, suicidal ideation, substance abuse, hallucinations, nervousness/anxiety, insomnia, memory loss.        Medications:   Current Outpatient Prescriptions:   ?  ADVAIR HFA 115-21 MCG/ACT IN Aerosol, Inhale 2 puffs 2 times daily.,

## 2017-03-12 NOTE — Progress Notes
Subjectively, the patient feels very well.  She has no bleeding diathesis.  She has no B symptoms.  She is agreeable to complete her iron infusions here in the clinic.  In that regard I have prescribed iron infusion.    I reviewed today on 03/12/2017 with the patient and her daughter the results of her laboratory evaluation from 03/05/2017 which showed a direct Coombs to be negative, ANA was positive at 1/80, CRP was elevated at 7.26.    CMP showed normal electrolytes, BUN was elevated at 44, creatinine was elevated to 1.25, blood glucose 199 which is also elevated, liver function tests were normal, EGFR was low at 42, the rest of the CMP was normal including LDH of 176.    Hemoglobin electrophoresis revealed normal adult hemoglobin present.    Serum protein electrophoresis and immunofixation did not reveal a monoclonal spike.    Vitamin B12 was above 0601 and folic acid was normal at 20.    Iron profile showed a serum iron 57, in the normal range, iron saturation was 13%, low, TIBC was 448, UIBC was elevated at 391, transferrin was elevated at 400, STRA was elevated to 38.4.    CBC showed a WBC count of 5.28, hemoglobin was 9.6, hematocrit 31.1 indicating anemia, platelet count was decreased at 101, WBC differential was normal. The rest of the CBC was normal. Reticulocyte count was at 1.3.    Sed rate was elevated at 80.    Hepatitis markers for hepatitis A, B, and C were negative.    Past Surgical History: Please see above.    Social History:    Social History     Social History   ? Marital status: Unknown     Spouse name: N/A   ? Number of children: N/A   ? Years of education: N/A     Occupational History   ? Not on file.     Social History Main Topics   ? Smoking status: Not on file   ? Smokeless tobacco: Not on file   ? Alcohol use Not on file   ? Drug use: Unknown   ? Sexual activity: Not on file     Other Topics Concern   ? Not on file     Social History Narrative   ? No narrative on file

## 2017-03-12 NOTE — Progress Notes
CT scans as per her primary team. The patient is extremely weak and she uses a rolling walker to ambulate since October 2018 and previously she used to walk with a cane. She is currently a physical therapy.    Previous surgeries are consistent with umbilical hernia repair, bladder tuck, hysterectomy but not oophorectomy, appendectomy and also cautery of her nose because of epistaxis.    The patient has never been a smoker but she used to abuse alcohol until 2-1/2 years ago. She drank mostly Chardonnay wine.    Regarding her family history her mother died from lung cancer and she was a heavy smoker and she had COPD. Her father died from myocardial infarction at the age of 76. The patient has 1 sister in good health and 3 children in good health as well as grandchildren in good health.    03/12/2017    The patient is returning today in this clinic in follow-up in continued evaluation and assessment for her anemia, for which the patient was hospitalized and she had to have 2 units of packed red blood cells transfusion. This happened again 2 years ago in the same hospital. On 02/12/2017 her CBC showed a hemoglobin of 8.2, hematocrit 24%, WBC 6.51, platelet count 158 and MCV of 77.  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 She has been sober for the last 2.5 years but she still continues to have liver issues. She had an upper endoscopy in May 2018, followed by gastroenterology. This revealed esophageal varices but no upper GI bleeding. Her daughter is a Engineer, civil (consulting)nurse in our local hospital and she remembered that when the patient was with severe anemia 2 years ago her hemoglobin was as low as 6.8. During her last hospital admission she received intravenous iron.    I reviewed today on 03/12/2017 with the patient her past medical, surgical, social and family histories and there were no changes compared to my initial evaluation as of 03/05/2017.

## 2017-03-12 NOTE — Progress Notes
?   UIBC 03/05/2017 391*   ? IRON 03/05/2017 57    ? Iron Saturation 03/05/2017 13*   ? Ferritin 03/05/2017 73    ? Transferrin 03/05/2017 400*   ? Vitamin B-12 03/05/2017 >2000*   ? FOLATE (FOLIC ACID), SER* 81/04/7508 >20.0    ? LD 03/05/2017 176    ? (LD) FRACTION 1 03/05/2017 25    ? (LD) FRACTION 2 03/05/2017 39    ? (LD) FRACTION 3 03/05/2017 22    ? (LD) FRACTION 4 03/05/2017 8    ? (LD) FRACTION 5 03/05/2017 6    ? RETICULIN IGA SCREEN 03/05/2017 Negative    ? Retic Ct Pct 03/05/2017 1.3    ? Total Protein 03/05/2017 6.6    ? Albumin Electrophoresis 03/05/2017 3.4    ? Alpha 1 03/05/2017 0.2    ? Alpha 2 03/05/2017 0.8    ? Beta Globulin 03/05/2017 1.2    ? Gamma Globulin 03/05/2017 1.0    ? M-Spike 03/05/2017 Not Observed    ? Globulin, Total 03/05/2017 3.2    ? A/G Ratio 03/05/2017 1.1    ? Please note: 03/05/2017 Comment    ? P E INTERPRETATION, S 03/05/2017 Comment    ? DAT 03/05/2017 Negative    ? GLUCOSE 03/05/2017 199*   ? BUN 03/05/2017 44*   ? CREATININE 03/05/2017 1.25*   ? eGFR IF NON AFRICN AM 03/05/2017 42*   ? EGFR IF AFRICN AM 03/05/2017 48*   ? BUN/Creatinine Ratio 03/05/2017 35*   ? Sodium 03/05/2017 138    ? POTASSIUM 03/05/2017 4.9    ? CHLORIDE 03/05/2017 99    ? CO2 03/05/2017 21    ? CALCIUM 03/05/2017 9.8    ? Total Protein 03/05/2017 6.7    ? Albumin 03/05/2017 4.0    ? Globulin, Total 03/05/2017 2.7    ? Albumin/Globulin Ratio 03/05/2017 1.5    ? Total Bilirubin 03/05/2017 0.8    ? Alkaline Phosphatase 03/05/2017 79    ? AST 03/05/2017 35    ? ALT 03/05/2017 23    ? Free Hemoglobin, Plasma 03/05/2017 Negative    ? Hgb F Quant 03/05/2017 0.0    ? Hgb A 03/05/2017 98.2    ? Hgb S 03/05/2017 0.0    ? Hgb C 03/05/2017 0.0    ? Hgb A2 Quant 03/05/2017 1.8    ? INTERPRETATION 03/05/2017 Comment    ? Soluble Transferrin Rece* 03/05/2017 38.4*   ? CRP, High Sensitivity 03/05/2017 7.26*   ? Hep A IgM 03/05/2017 Negative    ? Hepatitis B Surface Ag 03/05/2017 Negative

## 2017-03-20 ENCOUNTER — Inpatient Hospital Stay: Admit: 2017-03-20 | Discharge: 2017-03-21 | Primary: Internal Medicine

## 2017-03-20 DIAGNOSIS — K7460 Unspecified cirrhosis of liver: Secondary | ICD-10-CM

## 2017-03-20 DIAGNOSIS — K766 Portal hypertension: Secondary | ICD-10-CM

## 2017-03-20 DIAGNOSIS — D500 Iron deficiency anemia secondary to blood loss (chronic): Principal | ICD-10-CM

## 2017-03-20 MED ORDER — SODIUM CHLORIDE 0.9 % IV SOLN
Freq: Once | INTRAVENOUS | Status: CN
Start: 2017-03-20 — End: ?

## 2017-03-20 MED ORDER — EPINEPHRINE 0.3 MG/0.3ML IJ SOAJ
0.3 mg | INTRAMUSCULAR | Status: CN | PRN
Start: 2017-03-20 — End: ?

## 2017-03-20 MED ORDER — DEXAMETHASONE SODIUM PHOSPHATE 20 MG/5ML IJ SOLN
20 mg | INTRAVENOUS | Status: CN | PRN
Start: 2017-03-20 — End: ?

## 2017-03-20 MED ORDER — FERRIC CARBOXYMALTOSE IVPB
750 mg | Freq: Once | INTRAVENOUS | Status: DC
Start: 2017-03-20 — End: 2017-03-21

## 2017-03-20 MED ORDER — FERRIC CARBOXYMALTOSE IVPB
750 mg | Freq: Once | INTRAVENOUS | Status: CN
Start: 2017-03-20 — End: ?

## 2017-03-20 MED ORDER — SODIUM CHLORIDE FLUSH 0.9 % IV SOLN
10 mL | Status: CN | PRN
Start: 2017-03-20 — End: ?

## 2017-03-20 MED ORDER — HEPARIN SODIUM LOCK FLUSH 100 UNIT/ML IV SOLN
500 [IU] | Status: CN | PRN
Start: 2017-03-20 — End: ?

## 2017-03-20 MED ORDER — MEPERIDINE HCL 25 MG/ML IJ SOLN
25 mg | INTRAVENOUS | Status: CN | PRN
Start: 2017-03-20 — End: ?

## 2017-03-20 MED ORDER — SODIUM CHLORIDE 0.9 % IV SOLN
Freq: Once | INTRAVENOUS | Status: CP
Start: 2017-03-20 — End: ?

## 2017-03-20 MED ORDER — HYDROCORTISONE NA SUCCINATE PF 100 MG IJ SOLR
100 mg | INTRAVENOUS | Status: CN | PRN
Start: 2017-03-20 — End: ?

## 2017-03-20 MED ORDER — DIPHENHYDRAMINE HCL 50 MG/ML IJ SOLN
25 mg | INTRAVENOUS | Status: CN | PRN
Start: 2017-03-20 — End: ?

## 2017-03-27 ENCOUNTER — Inpatient Hospital Stay: Admit: 2017-03-27 | Discharge: 2017-03-28 | Primary: Internal Medicine

## 2017-03-27 DIAGNOSIS — D500 Iron deficiency anemia secondary to blood loss (chronic): Principal | ICD-10-CM

## 2017-03-27 MED ORDER — EPINEPHRINE 0.3 MG/0.3ML IJ SOAJ
0.3 mg | INTRAMUSCULAR | Status: CN | PRN
Start: 2017-03-27 — End: ?

## 2017-03-27 MED ORDER — HEPARIN SODIUM LOCK FLUSH 100 UNIT/ML IV SOLN
500 [IU] | Status: CN | PRN
Start: 2017-03-27 — End: ?

## 2017-03-27 MED ORDER — DIPHENHYDRAMINE HCL 50 MG/ML IJ SOLN
25 mg | INTRAVENOUS | Status: CN | PRN
Start: 2017-03-27 — End: ?

## 2017-03-27 MED ORDER — HYDROCORTISONE NA SUCCINATE PF 100 MG IJ SOLR
100 mg | INTRAVENOUS | Status: CN | PRN
Start: 2017-03-27 — End: ?

## 2017-03-27 MED ORDER — SODIUM CHLORIDE 0.9 % IV SOLN
Freq: Once | INTRAVENOUS | Status: CN
Start: 2017-03-27 — End: ?

## 2017-03-27 MED ORDER — DEXAMETHASONE SODIUM PHOSPHATE 20 MG/5ML IJ SOLN
20 mg | INTRAVENOUS | Status: CN | PRN
Start: 2017-03-27 — End: ?

## 2017-03-27 MED ORDER — FERRIC CARBOXYMALTOSE IVPB
750 mg | Freq: Once | INTRAVENOUS | Status: DC
Start: 2017-03-27 — End: 2017-03-28

## 2017-03-27 MED ORDER — FERRIC CARBOXYMALTOSE IVPB
750 mg | Freq: Once | INTRAVENOUS | Status: CN
Start: 2017-03-27 — End: ?

## 2017-03-27 MED ORDER — SODIUM CHLORIDE FLUSH 0.9 % IV SOLN
10 mL | Status: CN | PRN
Start: 2017-03-27 — End: ?

## 2017-03-27 MED ORDER — MEPERIDINE HCL 25 MG/ML IJ SOLN
25 mg | INTRAVENOUS | Status: CN | PRN
Start: 2017-03-27 — End: ?

## 2017-03-27 NOTE — Addendum Note
Addended by: Carollee HerterKHAN, YASMIN on: 03/27/2017 12:02 PM     Modules accepted: Orders

## 2017-06-11 ENCOUNTER — Ambulatory Visit: Admit: 2017-06-11 | Discharge: 2017-06-11 | Attending: Hematology & Oncology | Primary: Internal Medicine

## 2017-06-11 DIAGNOSIS — R944 Abnormal results of kidney function studies: Secondary | ICD-10-CM

## 2017-06-11 DIAGNOSIS — F1010 Alcohol abuse, uncomplicated: Secondary | ICD-10-CM

## 2017-06-11 DIAGNOSIS — D649 Anemia, unspecified: Secondary | ICD-10-CM

## 2017-06-11 DIAGNOSIS — K7030 Alcoholic cirrhosis of liver without ascites: Secondary | ICD-10-CM

## 2017-06-11 DIAGNOSIS — I10 Essential (primary) hypertension: Secondary | ICD-10-CM

## 2017-06-11 DIAGNOSIS — J449 Chronic obstructive pulmonary disease, unspecified: Secondary | ICD-10-CM

## 2017-06-11 DIAGNOSIS — E1165 Type 2 diabetes mellitus with hyperglycemia: Secondary | ICD-10-CM

## 2017-06-11 DIAGNOSIS — Z7951 Long term (current) use of inhaled steroids: Secondary | ICD-10-CM

## 2017-06-11 DIAGNOSIS — F17200 Nicotine dependence, unspecified, uncomplicated: Secondary | ICD-10-CM

## 2017-06-11 DIAGNOSIS — E039 Hypothyroidism, unspecified: Secondary | ICD-10-CM

## 2017-06-11 DIAGNOSIS — D509 Iron deficiency anemia, unspecified: Principal | ICD-10-CM

## 2017-06-11 DIAGNOSIS — D500 Iron deficiency anemia secondary to blood loss (chronic): Secondary | ICD-10-CM

## 2017-06-11 DIAGNOSIS — K766 Portal hypertension: Secondary | ICD-10-CM

## 2017-06-11 MED ORDER — DIPHENHYDRAMINE HCL 50 MG/ML IJ SOLN
25 mg | INTRAVENOUS | Status: CN | PRN
Start: 2017-06-11 — End: ?

## 2017-06-11 MED ORDER — MEPERIDINE HCL 25 MG/ML IJ SOLN
25 mg | INTRAVENOUS | Status: CN | PRN
Start: 2017-06-11 — End: ?

## 2017-06-11 MED ORDER — EPINEPHRINE 0.3 MG/0.3ML IJ SOAJ
0.3 mg | INTRAMUSCULAR | Status: CN | PRN
Start: 2017-06-11 — End: ?

## 2017-06-11 MED ORDER — HYDROCORTISONE NA SUCCINATE PF 100 MG IJ SOLR
100 mg | INTRAVENOUS | Status: CN | PRN
Start: 2017-06-11 — End: ?

## 2017-06-11 MED ORDER — FERRIC CARBOXYMALTOSE IVPB
750 mg | Freq: Once | INTRAVENOUS | Status: CN
Start: 2017-06-11 — End: ?

## 2017-06-11 MED ORDER — ROPINIROLE HCL 0.25 MG PO TABS
.25 mg | Freq: Three times a day (TID) | ORAL
Start: 2017-06-11 — End: ?

## 2017-06-11 MED ORDER — SODIUM CHLORIDE FLUSH 0.9 % IV SOLN
10 mL | Status: CN | PRN
Start: 2017-06-11 — End: ?

## 2017-06-11 MED ORDER — HEPARIN SODIUM LOCK FLUSH 100 UNIT/ML IV SOLN
500 [IU] | Status: CN | PRN
Start: 2017-06-11 — End: ?

## 2017-06-11 MED ORDER — SODIUM CHLORIDE 0.9 % IV SOLN
Freq: Once | INTRAVENOUS | Status: CN
Start: 2017-06-11 — End: ?

## 2017-06-11 MED ORDER — DEXAMETHASONE SODIUM PHOSPHATE 20 MG/5ML IJ SOLN
20 mg | INTRAVENOUS | Status: CN | PRN
Start: 2017-06-11 — End: ?

## 2017-06-20 ENCOUNTER — Inpatient Hospital Stay: Admit: 2017-06-20 | Discharge: 2017-06-21 | Primary: Internal Medicine

## 2017-06-20 DIAGNOSIS — Z79891 Long term (current) use of opiate analgesic: Secondary | ICD-10-CM

## 2017-06-20 DIAGNOSIS — K766 Portal hypertension: Secondary | ICD-10-CM

## 2017-06-20 DIAGNOSIS — D500 Iron deficiency anemia secondary to blood loss (chronic): Principal | ICD-10-CM

## 2017-06-20 DIAGNOSIS — Z794 Long term (current) use of insulin: Secondary | ICD-10-CM

## 2017-06-20 DIAGNOSIS — D649 Anemia, unspecified: Secondary | ICD-10-CM

## 2017-06-20 DIAGNOSIS — K7460 Unspecified cirrhosis of liver: Secondary | ICD-10-CM

## 2017-06-20 DIAGNOSIS — Z7951 Long term (current) use of inhaled steroids: Secondary | ICD-10-CM

## 2017-06-20 MED ORDER — DEXAMETHASONE SODIUM PHOSPHATE 20 MG/5ML IJ SOLN
20 mg | INTRAVENOUS | Status: CN | PRN
Start: 2017-06-20 — End: ?

## 2017-06-20 MED ORDER — DIPHENHYDRAMINE HCL 50 MG/ML IJ SOLN
25 mg | INTRAVENOUS | Status: CN | PRN
Start: 2017-06-20 — End: ?

## 2017-06-20 MED ORDER — HEPARIN SODIUM LOCK FLUSH 100 UNIT/ML IV SOLN
500 [IU] | Status: CN | PRN
Start: 2017-06-20 — End: ?

## 2017-06-20 MED ORDER — FERRIC CARBOXYMALTOSE IVPB
750 mg | Freq: Once | INTRAVENOUS | Status: CN
Start: 2017-06-20 — End: ?

## 2017-06-20 MED ORDER — MEPERIDINE HCL 25 MG/ML IJ SOLN
25 mg | INTRAVENOUS | Status: CN | PRN
Start: 2017-06-20 — End: ?

## 2017-06-20 MED ORDER — SODIUM CHLORIDE 0.9 % IV SOLN
Freq: Once | INTRAVENOUS | Status: CP
Start: 2017-06-20 — End: ?

## 2017-06-20 MED ORDER — SODIUM CHLORIDE FLUSH 0.9 % IV SOLN
10 mL | Status: CN | PRN
Start: 2017-06-20 — End: ?

## 2017-06-20 MED ORDER — HYDROCORTISONE NA SUCCINATE PF 100 MG IJ SOLR
100 mg | INTRAVENOUS | Status: CN | PRN
Start: 2017-06-20 — End: ?

## 2017-06-20 MED ORDER — SODIUM CHLORIDE 0.9 % IV SOLN
Freq: Once | INTRAVENOUS | Status: CN
Start: 2017-06-20 — End: ?

## 2017-06-20 MED ORDER — FERRIC CARBOXYMALTOSE IVPB
750 mg | Freq: Once | INTRAVENOUS | Status: DC
Start: 2017-06-20 — End: 2017-06-21

## 2017-06-20 MED ORDER — EPINEPHRINE 0.3 MG/0.3ML IJ SOAJ
0.3 mg | INTRAMUSCULAR | Status: CN | PRN
Start: 2017-06-20 — End: ?

## 2017-06-21 IMAGING — CT CT ABD-PELV W/ CM
2 of 5 series · 16 of 46 positions shown, 18 images · IV contrast (OMNIPAQUE 300)
Comparison: CT scan dated 11/21/2014

CLINICAL DATA: Mid abdominal pain.  Diarrhea.

EXAM:
CT ABDOMEN AND PELVIS WITH CONTRAST
TECHNIQUE: Multidetector CT imaging of the abdomen and pelvis was performed
using the standard protocol following bolus administration of
intravenous contrast.
CONTRAST:  1 OMNIPAQUE IOHEXOL 300 MG/ML SOLN, 100mL OMNIPAQUE
IOHEXOL 300 MG/ML SOLN

[Series 2: abd/pel with · axial · 0.86mm/px · z∈[-584,-194]mm · 13 of 90 slices shown, 15 images]
[im 6/90  soft-tissue]
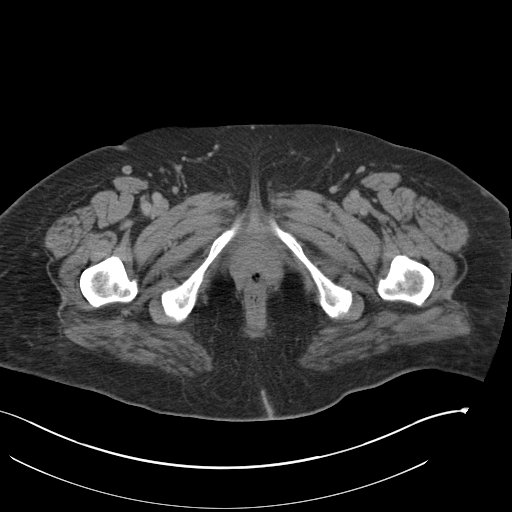
[im 6/90  bone]
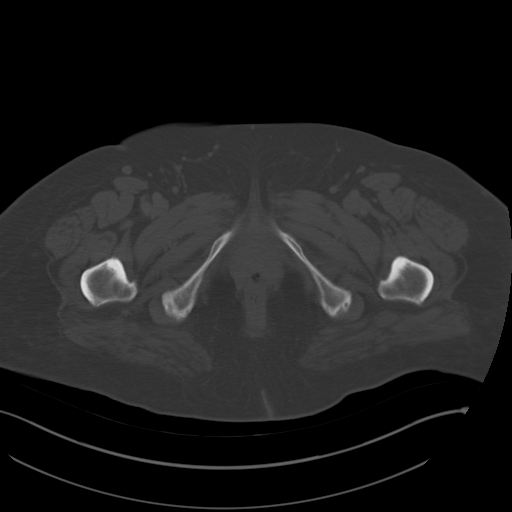
[im 11/90  soft-tissue]
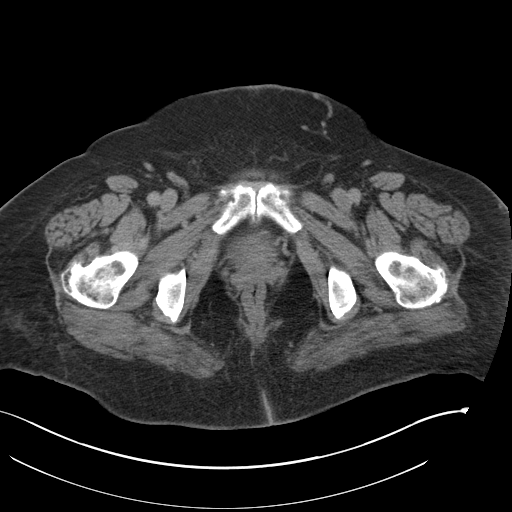
[im 21/90  soft-tissue]
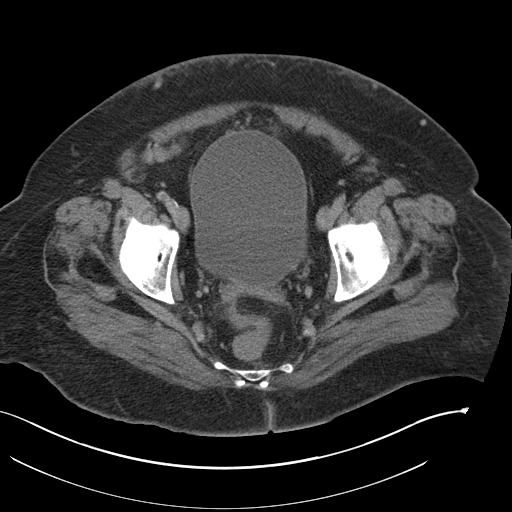
[im 27/90  soft-tissue]
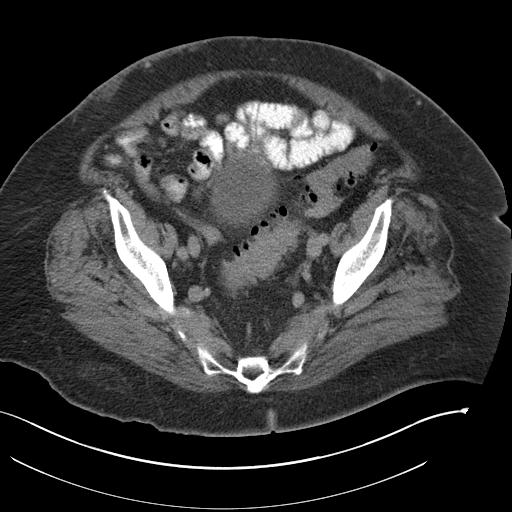
[im 32/90  soft-tissue]
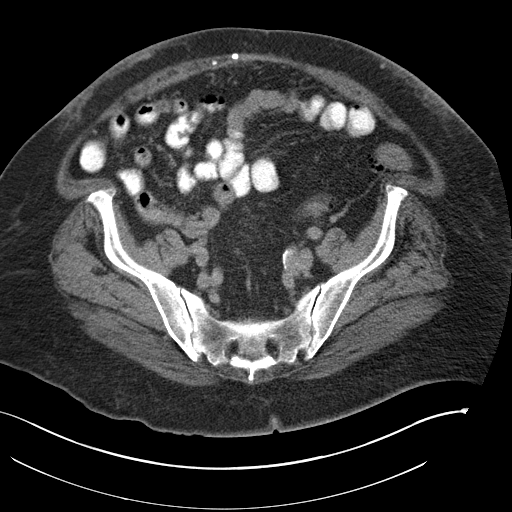
[im 37/90  soft-tissue]
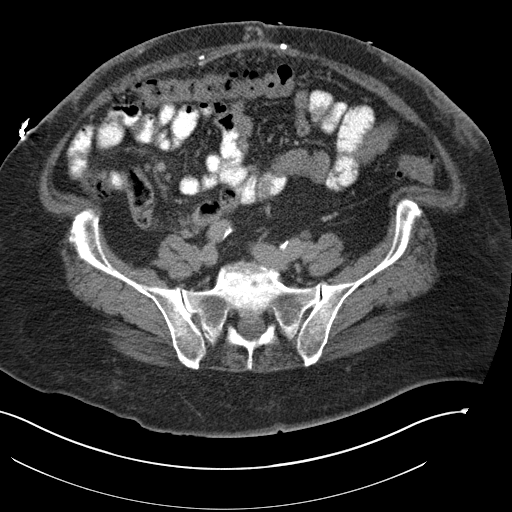
[im 48/90  soft-tissue]
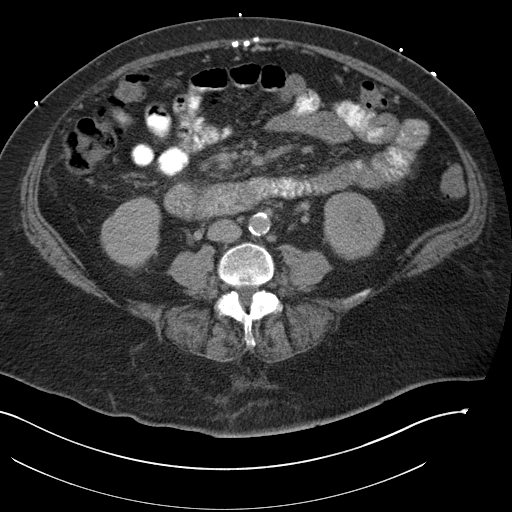
[im 53/90  soft-tissue]
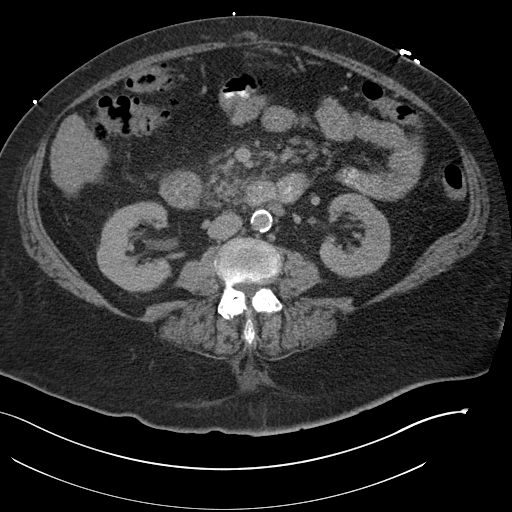
[im 58/90  soft-tissue]
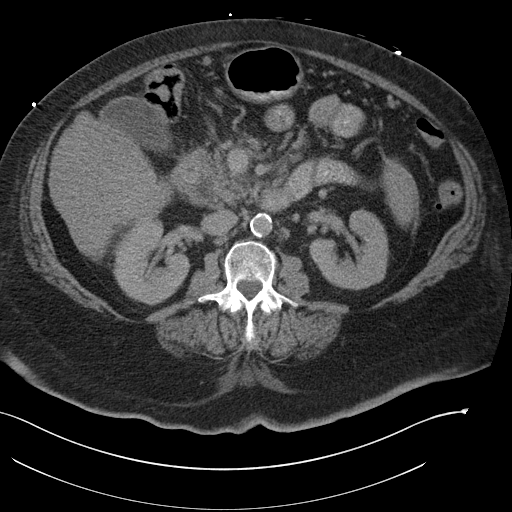
[im 58/90  bone]
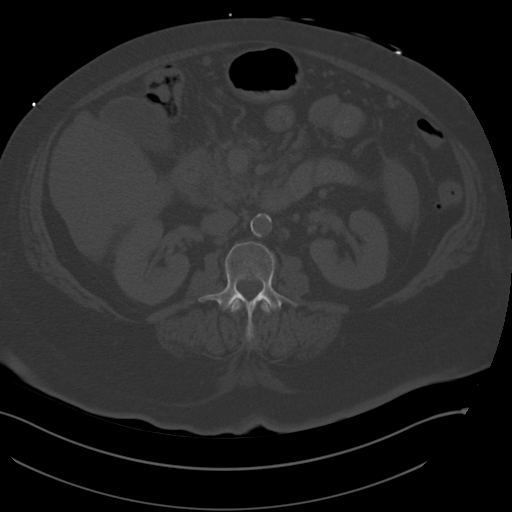
[im 63/90  soft-tissue]
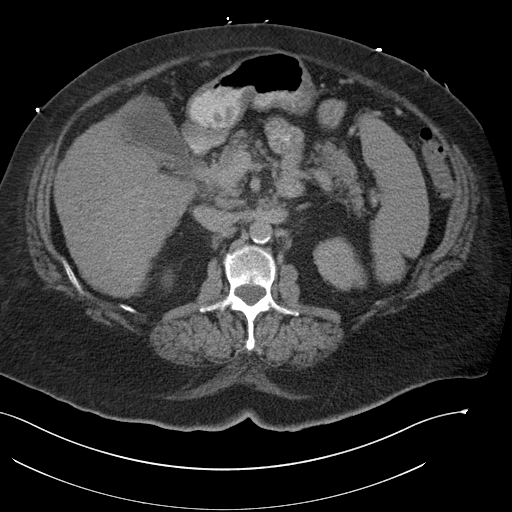
[im 69/90  soft-tissue]
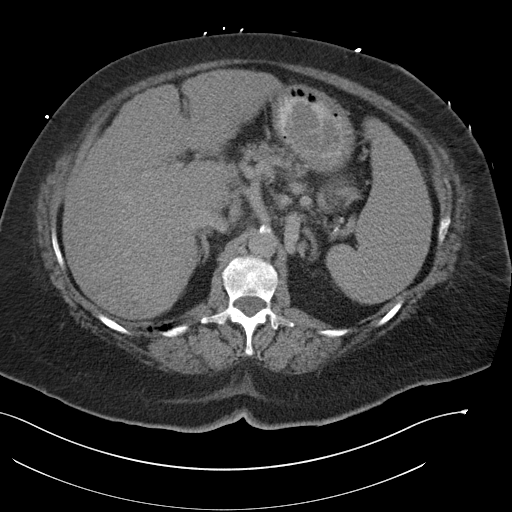
[im 79/90  soft-tissue]
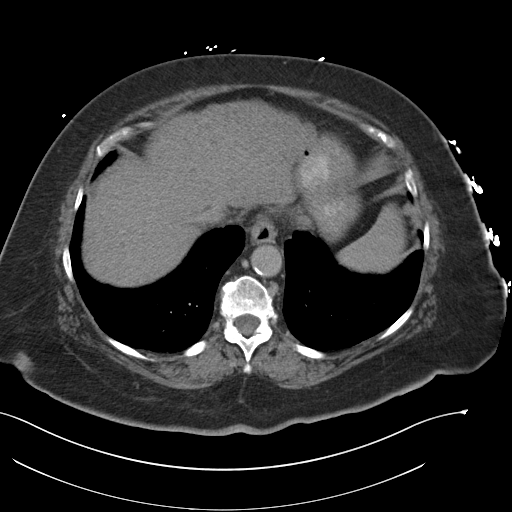
[im 84/90  soft-tissue]
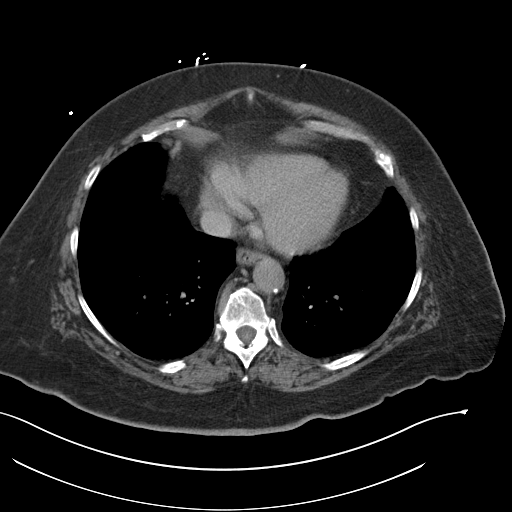

[Series 3: coronal a/|p · coronal · 0.88mm/px · 3 of 112 slices shown]
[im 38/112  soft-tissue]
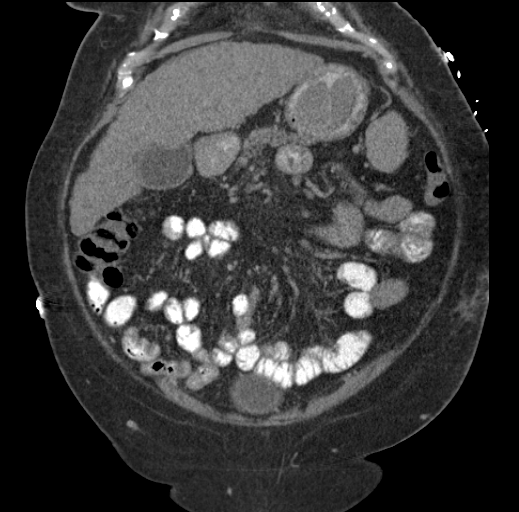
[im 50/112  soft-tissue]
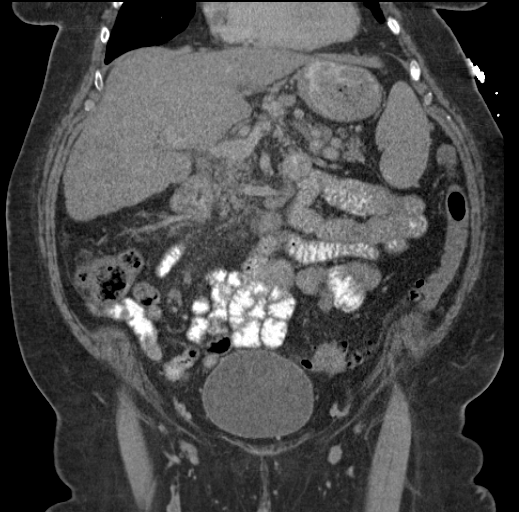
[im 62/112  soft-tissue]
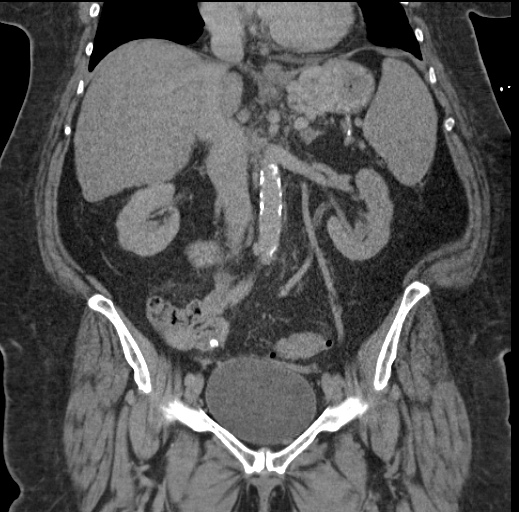

[16 of 46 positions shown; findings below may reference images not displayed]

FINDINGS: Lower chest: Distal esophageal varices. Minimal coronary artery
calcification. Lung bases are clear.

Hepatobiliary: Nodular inhomogeneous liver consistent with
cirrhosis. No discrete masses. Biliary tree is normal.

Pancreas: Normal.

Spleen: The spleen has slightly increased in size but is still
within normal limits. Multiple varices adjacent to the splenic hilum
and posterior to the fundus of the stomach extending into the distal
esophagus.

Adrenals/Urinary Tract: Normal adrenal glands. Normal kidneys.
Normal bladder and ureters.

Stomach/Bowel: There are numerous diverticula in the colon,
primarily in the distal descending and sigmoid portions without
discrete diverticulitis. Small bowel is normal including the
terminal ileum. Appendix is not discretely identified. There are
multiple tiny lymph nodes in the bowel mesentery, unchanged.

Vascular/Lymphatic: Extensive retrogastric varices in distal
esophageal varices consistent with portal hypertension secondary to
cirrhosis. Extensive aortic and common iliac atherosclerosis. No
adenopathy.

Reproductive: Ovaries are normal.  Uterus has been removed.

Other: No free air or free fluid.

Musculoskeletal: No acute osseous abnormalities. Degenerative disc
and joint disease in the lower lumbar spine.
IMPRESSION: 1. Cirrhosis with portal hypertension with extensive varices in the
upper abdomen and also around the distal esophagus slightly more
prominent than on the prior exam.
2. Slight increase in size of the spleen since the prior study.
3. Colonic diverticulosis without diverticulitis.
4. Aortic atherosclerosis.

## 2017-06-27 ENCOUNTER — Inpatient Hospital Stay: Admit: 2017-06-27 | Discharge: 2017-06-28 | Primary: Internal Medicine

## 2017-06-27 DIAGNOSIS — D649 Anemia, unspecified: Secondary | ICD-10-CM

## 2017-06-27 DIAGNOSIS — K7460 Unspecified cirrhosis of liver: Secondary | ICD-10-CM

## 2017-06-27 DIAGNOSIS — D500 Iron deficiency anemia secondary to blood loss (chronic): Principal | ICD-10-CM

## 2017-06-27 MED ORDER — FERRIC CARBOXYMALTOSE IVPB
750 mg | Freq: Once | INTRAVENOUS | Status: CN
Start: 2017-06-27 — End: ?

## 2017-06-27 MED ORDER — EPINEPHRINE 0.3 MG/0.3ML IJ SOAJ
0.3 mg | INTRAMUSCULAR | Status: CN | PRN
Start: 2017-06-27 — End: ?

## 2017-06-27 MED ORDER — MEPERIDINE HCL 25 MG/ML IJ SOLN
25 mg | INTRAVENOUS | Status: CN | PRN
Start: 2017-06-27 — End: ?

## 2017-06-27 MED ORDER — SODIUM CHLORIDE FLUSH 0.9 % IV SOLN
10 mL | Status: CN | PRN
Start: 2017-06-27 — End: ?

## 2017-06-27 MED ORDER — DIPHENHYDRAMINE HCL 50 MG/ML IJ SOLN
25 mg | INTRAVENOUS | Status: CN | PRN
Start: 2017-06-27 — End: ?

## 2017-06-27 MED ORDER — HYDROCORTISONE NA SUCCINATE PF 100 MG IJ SOLR
100 mg | INTRAVENOUS | Status: CN | PRN
Start: 2017-06-27 — End: ?

## 2017-06-27 MED ORDER — DEXAMETHASONE SODIUM PHOSPHATE 20 MG/5ML IJ SOLN
20 mg | INTRAVENOUS | Status: CN | PRN
Start: 2017-06-27 — End: ?

## 2017-06-27 MED ORDER — SODIUM CHLORIDE 0.9 % IV SOLN
Freq: Once | INTRAVENOUS | Status: CN
Start: 2017-06-27 — End: ?

## 2017-06-27 MED ORDER — HEPARIN SODIUM LOCK FLUSH 100 UNIT/ML IV SOLN
500 [IU] | Status: CN | PRN
Start: 2017-06-27 — End: ?

## 2017-06-27 MED ORDER — SODIUM CHLORIDE 0.9 % IV SOLN
Freq: Once | INTRAVENOUS | Status: CP
Start: 2017-06-27 — End: ?

## 2017-06-27 MED ORDER — FERRIC CARBOXYMALTOSE IVPB
750 mg | Freq: Once | INTRAVENOUS | Status: DC
Start: 2017-06-27 — End: 2017-06-28

## 2017-08-14 ENCOUNTER — Ambulatory Visit: Admit: 2017-08-14 | Discharge: 2017-08-14 | Attending: Hematology & Oncology | Primary: Internal Medicine

## 2017-08-14 DIAGNOSIS — D500 Iron deficiency anemia secondary to blood loss (chronic): Secondary | ICD-10-CM

## 2017-08-14 DIAGNOSIS — J449 Chronic obstructive pulmonary disease, unspecified: Secondary | ICD-10-CM

## 2017-08-14 DIAGNOSIS — R911 Solitary pulmonary nodule: Secondary | ICD-10-CM

## 2017-08-14 DIAGNOSIS — Z9989 Dependence on other enabling machines and devices: Secondary | ICD-10-CM

## 2017-08-14 DIAGNOSIS — E039 Hypothyroidism, unspecified: Secondary | ICD-10-CM

## 2017-08-14 DIAGNOSIS — Z801 Family history of malignant neoplasm of trachea, bronchus and lung: Secondary | ICD-10-CM

## 2017-08-14 DIAGNOSIS — D649 Anemia, unspecified: Principal | ICD-10-CM

## 2017-08-14 DIAGNOSIS — Z794 Long term (current) use of insulin: Secondary | ICD-10-CM

## 2017-08-14 DIAGNOSIS — I8510 Secondary esophageal varices without bleeding: Secondary | ICD-10-CM

## 2017-08-14 DIAGNOSIS — K766 Portal hypertension: Secondary | ICD-10-CM

## 2017-08-14 DIAGNOSIS — D759 Disease of blood and blood-forming organs, unspecified: Secondary | ICD-10-CM

## 2017-08-14 DIAGNOSIS — Z7951 Long term (current) use of inhaled steroids: Secondary | ICD-10-CM

## 2017-08-14 DIAGNOSIS — Z79899 Other long term (current) drug therapy: Secondary | ICD-10-CM

## 2017-08-14 DIAGNOSIS — K7030 Alcoholic cirrhosis of liver without ascites: Secondary | ICD-10-CM

## 2017-08-14 DIAGNOSIS — E119 Type 2 diabetes mellitus without complications: Secondary | ICD-10-CM

## 2017-08-14 DIAGNOSIS — G4730 Sleep apnea, unspecified: Secondary | ICD-10-CM

## 2017-08-14 DIAGNOSIS — I10 Essential (primary) hypertension: Secondary | ICD-10-CM

## 2017-08-14 DIAGNOSIS — F17200 Nicotine dependence, unspecified, uncomplicated: Secondary | ICD-10-CM

## 2017-08-14 DIAGNOSIS — Z90710 Acquired absence of both cervix and uterus: Secondary | ICD-10-CM

## 2017-11-13 ENCOUNTER — Inpatient Hospital Stay: Attending: Hematology & Oncology | Primary: Internal Medicine

## 2018-09-22 DEATH — deceased
